# Patient Record
Sex: Female | Born: 1965 | Race: Black or African American | Hispanic: No | Smoking: Never smoker
Health system: Southern US, Community
[De-identification: ages and names within clinical notes are randomized; demographics above are authoritative.]

## PROBLEM LIST (undated history)

## (undated) ENCOUNTER — Emergency Department (HOSPITAL_COMMUNITY): Source: Home / Self Care

## (undated) DIAGNOSIS — M1990 Unspecified osteoarthritis, unspecified site: Secondary | ICD-10-CM

## (undated) DIAGNOSIS — I10 Essential (primary) hypertension: Secondary | ICD-10-CM

## (undated) DIAGNOSIS — D509 Iron deficiency anemia, unspecified: Secondary | ICD-10-CM

## (undated) DIAGNOSIS — G43909 Migraine, unspecified, not intractable, without status migrainosus: Secondary | ICD-10-CM

## (undated) DIAGNOSIS — A599 Trichomoniasis, unspecified: Secondary | ICD-10-CM

## (undated) HISTORY — DX: Migraine, unspecified, not intractable, without status migrainosus: G43.909

## (undated) HISTORY — DX: Iron deficiency anemia, unspecified: D50.9

## (undated) HISTORY — DX: Trichomoniasis, unspecified: A59.9

## (undated) HISTORY — DX: Unspecified osteoarthritis, unspecified site: M19.90

## (undated) HISTORY — PX: ABDOMINAL HYSTERECTOMY: SHX81

## (undated) HISTORY — PX: ECTOPIC PREGNANCY SURGERY: SHX613

---

## 2001-03-27 ENCOUNTER — Emergency Department (HOSPITAL_COMMUNITY): Admission: EM | Admit: 2001-03-27 | Discharge: 2001-03-27 | Payer: Self-pay | Admitting: Emergency Medicine

## 2001-03-27 ENCOUNTER — Encounter: Payer: Self-pay | Admitting: *Deleted

## 2002-05-22 ENCOUNTER — Emergency Department (HOSPITAL_COMMUNITY): Admission: EM | Admit: 2002-05-22 | Discharge: 2002-05-22 | Payer: Self-pay | Admitting: Emergency Medicine

## 2007-02-11 ENCOUNTER — Encounter (HOSPITAL_COMMUNITY): Admission: RE | Admit: 2007-02-11 | Discharge: 2007-03-13 | Payer: Self-pay | Admitting: Oncology

## 2007-02-11 ENCOUNTER — Ambulatory Visit (HOSPITAL_COMMUNITY): Payer: Self-pay | Admitting: Oncology

## 2007-03-19 ENCOUNTER — Encounter (HOSPITAL_COMMUNITY): Admission: RE | Admit: 2007-03-19 | Discharge: 2007-04-18 | Payer: Self-pay | Admitting: Oncology

## 2007-03-24 ENCOUNTER — Inpatient Hospital Stay (HOSPITAL_COMMUNITY): Admission: RE | Admit: 2007-03-24 | Discharge: 2007-03-26 | Payer: Self-pay | Admitting: Obstetrics & Gynecology

## 2007-03-24 ENCOUNTER — Encounter: Payer: Self-pay | Admitting: Obstetrics & Gynecology

## 2007-10-12 ENCOUNTER — Emergency Department (HOSPITAL_COMMUNITY): Admission: EM | Admit: 2007-10-12 | Discharge: 2007-10-12 | Payer: Self-pay | Admitting: Emergency Medicine

## 2008-05-11 ENCOUNTER — Emergency Department (HOSPITAL_COMMUNITY): Admission: EM | Admit: 2008-05-11 | Discharge: 2008-05-12 | Payer: Self-pay | Admitting: Emergency Medicine

## 2010-07-04 ENCOUNTER — Emergency Department (HOSPITAL_COMMUNITY)
Admission: EM | Admit: 2010-07-04 | Discharge: 2010-07-04 | Payer: Self-pay | Source: Home / Self Care | Admitting: Emergency Medicine

## 2010-08-25 ENCOUNTER — Encounter (HOSPITAL_COMMUNITY): Payer: Self-pay | Admitting: Oncology

## 2010-10-15 LAB — RAPID STREP SCREEN (MED CTR MEBANE ONLY): Streptococcus, Group A Screen (Direct): NEGATIVE

## 2010-12-17 NOTE — Discharge Summary (Signed)
NAMEJAYMI, TINNER                 ACCOUNT NO.:  [id number]   MEDICAL RECORD NO.:  [id number]          PATIENT TYPE:  INP   LOCATION:  A331                          FACILITY:  [facility]   PHYSICIAN:  Lazaro Arms, M.D.   DATE OF BIRTH:  [birth year]   DATE OF ADMISSION:  03/24/2007  DATE OF DISCHARGE:  08/22/2008LH                               DISCHARGE SUMMARY   DISCHARGE DIAGNOSES:  1. Status post abdominal hysterectomy.  2. Unremarkable postoperative course.   PROCEDURE:  Hysterectomy.   Please refer to the history and physical and the operative report for  details on admission to the hospital.   HOSPITAL COURSE:  The patient was admitted postoperatively and she did  quite well.  She tolerated clear liquids and a regular diet without  symptoms.  Hemoglobin and hematocrit postop was 11.7 and 35 on postop  day #1 and on postoperative day #2 was once again very stable, actually  increased to 12.1 and 36.3. Her pathology was returned as benign  fibroids.  Her incision was clean, dry and intact. She tolerated clear  liquids and a regular diet, voided without symptoms and was ambulatory.  She was discharged to home on the morning of postop day #2 in good  stable condition to followup in the office. She was given Percocet for  pain, Motrin for pain and Phenergan for nausea.  She will be seen back  in the office next week for incision evaluation.      Lazaro Arms, M.D.  Electronically Signed     LHE/MEDQ  D:  04/23/2007  T:  04/23/2007  Job:  [id number]

## 2010-12-17 NOTE — H&P (Signed)
Vanessa [name], Vanessa [name]                 ACCOUNT NO.:  [id number]   MEDICAL RECORD NO.:  [id number]          PATIENT TYPE:  AMB   LOCATION:  DAY                           FACILITY:  [facility]   PHYSICIAN:  Lazaro Arms, M.D.   DATE OF BIRTH:  Jan 11, 1966   DATE OF ADMISSION:  DATE OF DISCHARGE:  LH                              HISTORY & PHYSICAL   HISTORY OF THE PRESENT ILLNESS:  The patient is a 45 year old African-  American female, gravida 2, para 2, who was actually referred to me by  Dr. Mariel Sleet because of iron-deficiency anemia and menometrorrhagia.  Her hemoglobin was as low as 8.3 and hematocrit 25.7.  She was begun on  aggressive ferrous sulfate and subsequently was sent to me for  evaluation.  She also has __________ as a result of her anemia.   I saw her in July.  On examination then she had a significantly enlarged  fibroid uterus to 16 weeks size with multiple fibroids palpated.  She  states she bleeds for five days with lots of clots, and soils clothes  and sheets, and has significant cramping.  Thus, as a result her  hemoglobin was up to 11.5 in the office; therefore, she is admitted for  an abdominal hysterectomy.  She understands and agrees with the  management plan.   PAST MEDICAL HISTORY:  The past medical history is negative, except for  anemia.   PAST SURGICAL HISTORY:  Tubal ligation in 1991.   MEDICATIONS:  The patient's only medication is iron.   ALLERGIES:  The patient is not allergic to anything.   PHYSICAL EXAMINATION:  VITAL SIGNS:  The patient is 5 feet 7 inches and  weighs 120 pounds.  Blood pressure 100/70.  HEENT:  The head, eyes, ears, nose and throat is unremarkable.  NECK:  The thyroid is normal.  LUNGS:  The lungs are clear.  HEART:  The heart has a regular rate and rhythm with murmur,  regurgitation or gallop.  She does have a slight flow murmur, which is  soft.  BREASTS:  The breast are without mass, discharge or skin changes.  ABDOMEN:  The  abdomen is benign without hepatosplenomegaly.  She does  have a mass in the mid plane palpated as her fibroid uterus.  PELVIC EXAMINATION:  There is a normal external genitalia.  Vagina is  moist without discharge.  The uterus is 16 weeks size with multiple  fibroids palpated.  Adnexa is negative.  EXTREMITIES:  The extremities are without edema.  NEUROLOGIC:  The neurological exam is grossly intact.   IMPRESSION:  1. Enlarged fibroid uterus.  2. Menometrorrhagia.  3. Anemia.  4. Dysmenorrhea.   PLAN:  The patient is admitted for an abdominal hysterectomy.  She  understands the risks, benefits, indications and alternatives; and, will  proceed.      Lazaro Arms, M.D.  Electronically Signed     LHE/MEDQ  D:  03/23/2007  T:  03/24/2007  Job:  [id number]

## 2010-12-17 NOTE — Op Note (Signed)
[name], [name]                 ACCOUNT NO.:  [id number]   MEDICAL RECORD NO.:  [id number]          PATIENT TYPE:  INP   LOCATION:  A331                          FACILITY:  [facility]   PHYSICIAN:  Lazaro Arms, M.D.   DATE OF BIRTH:  1966/02/07   DATE OF PROCEDURE:  03/24/2007  DATE OF DISCHARGE:                               OPERATIVE REPORT   PREOPERATIVE DIAGNOSES:  1. Fibroids.  2. Menometrorrhagia.  3. Anemia, resolved with weekly intravenous iron.   POSTOPERATIVE DIAGNOSES:  1. Fibroids.  2. Menometrorrhagia.  3. Anemia, resolved with weekly intravenous iron.   PROCEDURE:  Abdominal hysterectomy.   SURGEON:  Lazaro Arms, MD   ANESTHESIA:  General endotracheal.   FINDINGS:  The patient had a large fibroid uterus, probably 14- to 16-  week size.  Multiple fibroids were palpated.  The ovaries were normal.   DESCRIPTION OF OPERATION:  The patient was taken to the operating room,  placed in supine position, underwent general endotracheal anesthesia.  The vagina was prepped.  Foley catheter was placed.  The abdomen was  prepped and draped in the usual sterile fashion.  A transverse minilap  incision was made, carried down sharply through the rectus fascia,  scored in the midline.  The muscles were divided.  The peritoneal cavity  was entered.  A small Alexis self-retaining wound retractor was placed.  The upper abdomen was packed away.  The uterine cornua were grasped.  Round ligament was suture ligated and cut bilaterally.  The utero-  ovarian ligaments were clamped, cut and suture ligated bilaterally.  The  bladder flap was created.  Uterine vessels were skeletonized, clamped,  cut and suture ligated bilaterally.  Several pedicles were taken down to  cardinal ligament down to the cervix, each pedicle being clamped, cut  and suture ligated.  The vagina was crossclamped.  Vaginal angle sutures  were placed.  The vagina was closed with interrupted figure-of-eight  sutures.  The specimen was removed and sent to pathology for routine  evaluation.  The pelvis was irrigated vigorously and all pedicles were  found to be hemostatic.  Interceed was placed to prevent postoperative  adhesion.  The retractor was removed, the muscles reapproximated loosely  along with the peritoneum.  The fascia closed using #0 Vicryl running.  Subcutaneous tissues made hemostatic and  irrigated.  The skin was closed using 3-0 Vicryl on a Keith needle in a  subcuticular fashion, and Dermabond was placed for additional skin  adhesion and bandage.  The patient tolerated the procedure well.  She  experienced 50 cc of blood loss.  She was taken to the recovery room in  good stable condition.  All counts were correct times 3.      Lazaro Arms, M.D.  Electronically Signed     LHE/MEDQ  D:  03/24/2007  T:  03/25/2007  Job:  [id number]

## 2011-05-16 LAB — CBC
HCT: 34.9 — ABNORMAL LOW
HCT: 39.6
Hemoglobin: 12.9
MCHC: 32.7
MCHC: 33.5
MCV: 74.1 — ABNORMAL LOW
MCV: 74.1 — ABNORMAL LOW
MCV: 74.7 — ABNORMAL LOW
Platelets: 218
Platelets: 224
Platelets: 239
RBC: 4.86
RBC: 5.34 — ABNORMAL HIGH
RDW: 24.5 — ABNORMAL HIGH
RDW: 25 — ABNORMAL HIGH
WBC: 10.3
WBC: 14.7 — ABNORMAL HIGH
WBC: 3.8 — ABNORMAL LOW

## 2011-05-16 LAB — URINALYSIS, ROUTINE W REFLEX MICROSCOPIC
Bilirubin Urine: NEGATIVE
Nitrite: NEGATIVE
Specific Gravity, Urine: 1.015
Urobilinogen, UA: 0.2
pH: 6

## 2011-05-16 LAB — DIFFERENTIAL
Basophils Absolute: 0
Basophils Absolute: 0
Basophils Relative: 0
Eosinophils Absolute: 0
Eosinophils Relative: 0
Eosinophils Relative: 1
Lymphocytes Relative: 12
Lymphs Abs: 1.2
Monocytes Relative: 7
Neutrophils Relative %: 80 — ABNORMAL HIGH
Neutrophils Relative %: 87 — ABNORMAL HIGH

## 2011-05-16 LAB — BASIC METABOLIC PANEL
BUN: 7
CO2: 30
Calcium: 9
Chloride: 104
Creatinine, Ser: 0.79
GFR calc Af Amer: >60
GFR calc non Af Amer: >60
Glucose, Bld: 86
Potassium: 3.4 — ABNORMAL LOW
Sodium: 136

## 2011-05-16 LAB — CROSSMATCH

## 2011-05-16 LAB — URINE MICROSCOPIC-ADD ON

## 2011-05-20 LAB — IRON AND TIBC
Saturation Ratios: 19 — ABNORMAL LOW
TIBC: 345
UIBC: 279

## 2011-05-20 LAB — CBC
Hemoglobin: 11.4 — ABNORMAL LOW
RDW: 29 — ABNORMAL HIGH
WBC: 5.3

## 2011-05-20 LAB — RETICULOCYTES: Retic Ct Pct: 3.3 — ABNORMAL HIGH

## 2012-08-16 ENCOUNTER — Emergency Department (HOSPITAL_COMMUNITY)
Admission: EM | Admit: 2012-08-16 | Discharge: 2012-08-16 | Disposition: A | Discharge status: Home / Self Care | Payer: Self-pay | Attending: Emergency Medicine | Admitting: Emergency Medicine

## 2012-08-16 ENCOUNTER — Encounter (HOSPITAL_COMMUNITY): Payer: Self-pay

## 2012-08-16 ENCOUNTER — Emergency Department (HOSPITAL_COMMUNITY): Payer: Self-pay

## 2012-08-16 DIAGNOSIS — R1033 Periumbilical pain: Secondary | ICD-10-CM | POA: Insufficient documentation

## 2012-08-16 DIAGNOSIS — I10 Essential (primary) hypertension: Secondary | ICD-10-CM | POA: Insufficient documentation

## 2012-08-16 DIAGNOSIS — N390 Urinary tract infection, site not specified: Secondary | ICD-10-CM | POA: Insufficient documentation

## 2012-08-16 DIAGNOSIS — R109 Unspecified abdominal pain: Secondary | ICD-10-CM

## 2012-08-16 HISTORY — DX: Essential (primary) hypertension: I10

## 2012-08-16 LAB — URINALYSIS, ROUTINE W REFLEX MICROSCOPIC
Bilirubin Urine: NEGATIVE
Glucose, UA: NEGATIVE mg/dL
Protein, ur: NEGATIVE mg/dL
Specific Gravity, Urine: 1.015 (ref 1.005–1.030)

## 2012-08-16 LAB — URINE MICROSCOPIC-ADD ON

## 2012-08-16 LAB — COMPREHENSIVE METABOLIC PANEL
AST: 13 U/L (ref 0–37)
BUN: 9 mg/dL (ref 6–23)
CO2: 28 mEq/L (ref 19–32)
Chloride: 105 mEq/L (ref 96–112)
Creatinine, Ser: 0.98 mg/dL (ref 0.50–1.10)
GFR calc Af Amer: 79 mL/min — ABNORMAL LOW (ref >90)
GFR calc non Af Amer: 68 mL/min — ABNORMAL LOW (ref >90)
Glucose, Bld: 97 mg/dL (ref 70–99)
Total Bilirubin: 0.8 mg/dL (ref 0.3–1.2)

## 2012-08-16 LAB — CBC WITH DIFFERENTIAL/PLATELET
HCT: 39.2 % (ref 36.0–46.0)
Hemoglobin: 13.3 g/dL (ref 12.0–15.0)
Lymphocytes Relative: 29 % (ref 12–46)
Monocytes Absolute: 0.5 10*3/uL (ref 0.1–1.0)
Monocytes Relative: 10 % (ref 3–12)
Neutro Abs: 2.9 10*3/uL (ref 1.7–7.7)
WBC: 5.1 10*3/uL (ref 4.0–10.5)

## 2012-08-16 LAB — PREGNANCY, URINE: Preg Test, Ur: NEGATIVE

## 2012-08-16 LAB — LIPASE, BLOOD: Lipase: 31 U/L (ref 11–59)

## 2012-08-16 MED ORDER — SULFAMETHOXAZOLE-TRIMETHOPRIM 200-40 MG/5ML PO SUSP: 20.0000 mL | Freq: Once | ORAL | Status: DC

## 2012-08-16 MED ORDER — SULFAMETHOXAZOLE-TMP DS 800-160 MG PO TABS
1.0000 | ORAL_TABLET | Freq: Once | ORAL | Status: AC
  Administered 2012-08-16: 1 via ORAL
  Filled 2012-08-16: qty 1

## 2012-08-16 MED ORDER — NAPROXEN 500 MG PO TABS: 500.0000 mg | ORAL_TABLET | Freq: Two times a day (BID) | ORAL | Status: DC

## 2012-08-16 MED ORDER — NAPROXEN 250 MG PO TABS
500.0000 mg | ORAL_TABLET | Freq: Once | ORAL | Status: AC
  Administered 2012-08-16: 500 mg via ORAL
  Filled 2012-08-16: qty 2

## 2012-08-16 MED ORDER — SULFAMETHOXAZOLE-TRIMETHOPRIM 800-160 MG PO TABS: 1.0000 | ORAL_TABLET | Freq: Two times a day (BID) | ORAL | Status: DC

## 2012-08-16 NOTE — ED Notes (Signed)
Pain going across my stomach per pt. Sharp pain per pt. Denies nausea, vomiting, and diarrhea per pt. Woke up with the pain per pt.

## 2012-08-16 NOTE — ED Provider Notes (Signed)
History     CSN: [id number]  Arrival date & time 08/16/12  4098   First MD Initiated Contact with Patient 08/16/12 0354      Chief Complaint  Patient presents with  . Abdominal Pain    (Consider location/radiation/quality/duration/timing/severity/associated sxs/prior treatment) HPI Comments: 47 year old female with a history of hysterectomy several years ago who presents with a complaint of abdominal pain which she states is periumbilical. This was acute in onset, awoke her from sleep, is sharp and stabbing and comes in waves. She denies nausea vomiting or diarrhea and has very rare bowel movements which is normal for her to have only one bowel movement per week. She has not had dysuria, she denies fevers, nothing seems to make the pain better but it is worse when she palpates her abdomen.  Patient is a 47 y.o. female presenting with abdominal pain. The history is provided by the patient and the spouse.  Abdominal Pain The primary symptoms of the illness include abdominal pain.    Past Medical History  Diagnosis Date  . Hypertension     Past Surgical History  Procedure Date  . Abdominal hysterectomy     No family history on file.  History  Substance Use Topics  . Smoking status: Never Smoker   . Smokeless tobacco: Not on file  . Alcohol Use: No    OB History    Grav Para Term Preterm Abortions TAB SAB Ect Mult Living                  Review of Systems  Gastrointestinal: Positive for abdominal pain.  All other systems reviewed and are negative.    Allergies  Review of patient's allergies indicates no known allergies.  Home Medications   Current Outpatient Rx  Name  Route  Sig  Dispense  Refill  . NAPROXEN 500 MG PO TABS   Oral   Take 1 tablet (500 mg total) by mouth 2 (two) times daily with a meal.   30 tablet   0   . SULFAMETHOXAZOLE-TRIMETHOPRIM 800-160 MG PO TABS   Oral   Take 1 tablet by mouth every 12 (twelve) hours.   10 tablet   0     BP  174/106  Pulse 76  Temp 97.8 F (36.6 C)  Resp 16  Ht 5\' 6"  (1.676 m)  Wt 125 lb (56.7 kg)  BMI 20.18 kg/m2  SpO2 100%  Physical Exam  Nursing note and vitals reviewed. Constitutional: She appears well-developed and well-nourished. No distress.  HENT:  Head: Normocephalic and atraumatic.  Mouth/Throat: Oropharynx is clear and moist. No oropharyngeal exudate.  Eyes: Conjunctivae normal and EOM are normal. Pupils are equal, round, and reactive to light. Right eye exhibits no discharge. Left eye exhibits no discharge. No scleral icterus.  Neck: Normal range of motion. Neck supple. No JVD present. No thyromegaly present.  Cardiovascular: Normal rate, regular rhythm, normal heart sounds and intact distal pulses.  Exam reveals no gallop and no friction rub.   No murmur heard. Pulmonary/Chest: Effort normal and breath sounds normal. No respiratory distress. She has no wheezes. She has no rales.  Abdominal: Soft. Bowel sounds are normal. She exhibits no distension and no mass. There is tenderness ( Focal left upper quadrant tenderness, no other abdominal tenderness. Very soft, very non-peritoneal, no guarding).  Musculoskeletal: Normal range of motion. She exhibits no edema and no tenderness.  Lymphadenopathy:    She has no cervical adenopathy.  Neurological: She is alert. Coordination normal.  Skin: Skin is warm and dry. No rash noted. No erythema.  Psychiatric: She has a normal mood and affect. Her behavior is normal.    ED Course  Procedures (including critical care time)  Labs Reviewed  URINALYSIS, ROUTINE W REFLEX MICROSCOPIC - Abnormal; Notable for the following:    Hgb urine dipstick TRACE (*)     Leukocytes, UA SMALL (*)     All other components within normal limits  COMPREHENSIVE METABOLIC PANEL - Abnormal; Notable for the following:    GFR calc non Af Amer 68 (*)     GFR calc Af Amer 79 (*)     All other components within normal limits  URINE MICROSCOPIC-ADD ON - Abnormal;  Notable for the following:    Squamous Epithelial / LPF MANY (*)     Bacteria, UA MANY (*)     All other components within normal limits  PREGNANCY, URINE  CBC WITH DIFFERENTIAL  LIPASE, BLOOD  URINE CULTURE   Dg Abd Acute W/chest  08/16/2012  *RADIOLOGY REPORT*  Clinical Data: Sharp stomach pain.  ACUTE ABDOMEN SERIES (ABDOMEN 2 VIEW & CHEST 1 VIEW)  Comparison: Chest 07/04/2010  Findings: Mild hyperinflation. Normal heart size and pulmonary vascularity.  No focal consolidation in the lungs.  No blunting of costophrenic angles.  No pneumothorax.  Mediastinal contours appear intact.  No significant change since previous study.  Scattered gas and stool in the colon.  No small or large bowel dilatation.  No free intra-abdominal air.  No abnormal air fluid levels.  No radiopaque stones.  IMPRESSION: No evidence of active pulmonary disease.  Nonobstructive bowel gas pattern.   Original Report Authenticated By: Burman Nieves, M.D.      1. Abdominal pain   2. UTI (urinary tract infection)       MDM  Check for urinary infection, rule out pancreatitis, acute abdominal series to evaluate bowels, constipation, possible bowel obstruction though less likely given no distention and no nausea.  Labs reviewed, no leukocytosis, normal lytes and renal function - UA shows UTI with many bacteria and 11-20 WBC.  She has a normal xray without acute findings including no free air and no SBO.  Pt reevluated - naprosyn and bactrim given - she appears stable and is willing to continue treatment at home - will f/u PRN.  PA and abd imaging were obtained by digital radiography. I have personally interpreted these x-rays and find her to be no signs of pulmonary infiltrate, cardiomegaly, subdiaphragmatic free air, soft tissue abnormality, no obvious bony abnormalities or fractures.    Vida Roller, MD 08/16/12 [phone]

## 2012-08-16 NOTE — ED Notes (Signed)
Patient ambulatory to bathroom.

## 2012-08-17 LAB — URINE CULTURE: Colony Count: >=100000

## 2013-06-18 ENCOUNTER — Emergency Department (HOSPITAL_COMMUNITY): Payer: Self-pay

## 2013-06-18 ENCOUNTER — Encounter (HOSPITAL_COMMUNITY): Payer: Self-pay | Admitting: Emergency Medicine

## 2013-06-18 ENCOUNTER — Emergency Department (HOSPITAL_COMMUNITY)
Admission: EM | Admit: 2013-06-18 | Discharge: 2013-06-18 | Disposition: A | Discharge status: Home / Self Care | Payer: Self-pay | Attending: Emergency Medicine | Admitting: Emergency Medicine

## 2013-06-18 DIAGNOSIS — Y929 Unspecified place or not applicable: Secondary | ICD-10-CM | POA: Insufficient documentation

## 2013-06-18 DIAGNOSIS — W010XXA Fall on same level from slipping, tripping and stumbling without subsequent striking against object, initial encounter: Secondary | ICD-10-CM | POA: Insufficient documentation

## 2013-06-18 DIAGNOSIS — Y9301 Activity, walking, marching and hiking: Secondary | ICD-10-CM | POA: Insufficient documentation

## 2013-06-18 DIAGNOSIS — S93409A Sprain of unspecified ligament of unspecified ankle, initial encounter: Secondary | ICD-10-CM | POA: Insufficient documentation

## 2013-06-18 DIAGNOSIS — W108XXA Fall (on) (from) other stairs and steps, initial encounter: Secondary | ICD-10-CM | POA: Insufficient documentation

## 2013-06-18 DIAGNOSIS — Z791 Long term (current) use of non-steroidal anti-inflammatories (NSAID): Secondary | ICD-10-CM | POA: Insufficient documentation

## 2013-06-18 DIAGNOSIS — S93401A Sprain of unspecified ligament of right ankle, initial encounter: Secondary | ICD-10-CM

## 2013-06-18 DIAGNOSIS — I10 Essential (primary) hypertension: Secondary | ICD-10-CM | POA: Insufficient documentation

## 2013-06-18 MED ORDER — HYDROCODONE-ACETAMINOPHEN 5-325 MG PO TABS: ORAL_TABLET | ORAL | Status: DC

## 2013-06-18 MED ORDER — NAPROXEN 250 MG PO TABS: 250.0000 mg | ORAL_TABLET | Freq: Two times a day (BID) | ORAL | Status: DC

## 2013-06-18 NOTE — ED Provider Notes (Signed)
CSN: [id number]     Arrival date & time 06/18/13  1658 History   First MD Initiated Contact with Patient 06/18/13 1705     Chief Complaint  Patient presents with  . Ankle Pain    HPI Pt was seen at 1715. Per pt, c/o sudden onset and resolution of one episode of trip on the steps PTA. Pt states she was walking down the steps and stepped in a gap between the steps and the ground, twisting her right ankle. Pt c/o pain in her right ankle only. Has been ambulatory since the injury. Denies right hip/knee/foot pain. Denies focal motor weakness, no tingling/numbness in extremities. Denies open wounds.     Past Medical History  Diagnosis Date  . Hypertension    Past Surgical History  Procedure Laterality Date  . Abdominal hysterectomy      History  Substance Use Topics  . Smoking status: Never Smoker   . Smokeless tobacco: Not on file  . Alcohol Use: No    Review of Systems .ROS: Statement: All systems negative except as marked or noted in the HPI; Constitutional: Negative for fever and chills. ; ; Eyes: Negative for eye pain, redness and discharge. ; ; ENMT: Negative for ear pain, hoarseness, nasal congestion, sinus pressure and sore throat. ; ; Cardiovascular: Negative for chest pain, palpitations, diaphoresis, dyspnea and peripheral edema. ; ; Respiratory: Negative for cough, wheezing and stridor. ; ; Gastrointestinal: Negative for nausea, vomiting, diarrhea, abdominal pain, blood in stool, hematemesis, jaundice and rectal bleeding. . ; ; Genitourinary: Negative for dysuria, flank pain and hematuria. ; ; Musculoskeletal: Negative for back pain and neck pain. +right ankle pain and swelling.; ; Skin: Negative for pruritus, rash, abrasions, blisters, bruising and skin lesion.; ; Neuro: Negative for headache, lightheadedness and neck stiffness. Negative for weakness, altered level of consciousness , altered mental status, extremity weakness, paresthesias, involuntary movement, seizure and syncope.        Allergies  Review of patient's allergies indicates no known allergies.  Home Medications   Current Outpatient Rx  Name  Route  Sig  Dispense  Refill  . HYDROcodone-acetaminophen (NORCO/VICODIN) 5-325 MG per tablet      1 or 2 tabs PO q6 hours prn pain   10 tablet   0   . naproxen (NAPROSYN) 250 MG tablet   Oral   Take 1 tablet (250 mg total) by mouth 2 (two) times daily with a meal.   14 tablet   0   . naproxen (NAPROSYN) 500 MG tablet   Oral   Take 1 tablet (500 mg total) by mouth 2 (two) times daily with a meal.   30 tablet   0   . sulfamethoxazole-trimethoprim (SEPTRA DS) 800-160 MG per tablet   Oral   Take 1 tablet by mouth every 12 (twelve) hours.   10 tablet   0    BP 138/76  Pulse 97  Temp(Src) 97.5 F (36.4 C) (Oral)  Resp 24  Wt 125 lb (56.7 kg)  SpO2 100% Physical Exam 1720: Physical examination:  Nursing notes reviewed; Vital signs and O2 SAT reviewed;  Constitutional: Well developed, Well nourished, Well hydrated, In no acute distress; Head:  Normocephalic, atraumatic; Eyes: EOMI, PERRL, No scleral icterus; ENMT: Mouth and pharynx normal, Mucous membranes moist; Neck: Supple, Full range of motion, No lymphadenopathy; Cardiovascular: Regular rate and rhythm, No gallop; Respiratory: Breath sounds clear & equal bilaterally, No wheezes.  Speaking full sentences with ease, Normal respiratory effort/excursion; Chest: Nontender, Movement  normal; Abdomen: Soft, Nontender, Nondistended, Normal bowel sounds;; Extremities: Pulses normal, +tender to palp right lateral maleolar area w/localized edema, NMS intact right foot, strong pedal pp, LE muscle compartments soft.  No right proximal fibular head tenderness, no knee tenderness, no foot tenderness.  No deformity, no ecchymosis, no open wounds.  +plantarflexion of right foot w/calf squeeze.  No palpable gap right Achilles's tendon.  Decreased ROM F/E d/t pain. No calf edema or asymmetry.; Neuro: AA&Ox3, Major CN  grossly intact.  Speech clear. No gross focal motor or sensory deficits in extremities.; Skin: Color normal, Warm, Dry.   ED Course  Procedures    EKG Interpretation   None       MDM  MDM Reviewed: previous chart, nursing note and vitals Interpretation: x-ray   Dg Ankle Complete Right 06/18/2013   CLINICAL DATA:  Right foot and ankle pain.  EXAM: RIGHT ANKLE - COMPLETE 3+ VIEW  COMPARISON:  None.  FINDINGS: Right ankle demonstrates no fracture or dislocation. The ankle mortise is intact. There is mild soft tissue swelling over the lateral malleolus.  IMPRESSION: No acute osseous injury of the right ankle.   Electronically Signed   By: Elige Ko   On: 06/18/2013 17:29   Dg Foot Complete Right 06/18/2013   CLINICAL DATA:  Twisted the right ankle and foot earlier today  EXAM: RIGHT FOOT COMPLETE - 3+ VIEW  COMPARISON:  None.  FINDINGS: There is no evidence of fracture or dislocation. There is no evidence of arthropathy or other focal bone abnormality. Soft tissues are unremarkable.  IMPRESSION: No acute osseous injury of the right foot.   Electronically Signed   By: Elige Ko   On: 06/18/2013 17:29    1745:  No fx, will treat symptomatically. Dx and testing d/w pt.  Questions answered.  Verb understanding, agreeable to d/c home with outpt f/u.   Laray Anger, DO 06/19/13 1421

## 2013-06-18 NOTE — ED Notes (Signed)
Twisted right foot today .

## 2014-08-11 ENCOUNTER — Other Ambulatory Visit (HOSPITAL_COMMUNITY): Payer: Self-pay | Admitting: Internal Medicine

## 2014-08-11 DIAGNOSIS — Z1231 Encounter for screening mammogram for malignant neoplasm of breast: Secondary | ICD-10-CM

## 2014-09-04 ENCOUNTER — Ambulatory Visit (HOSPITAL_COMMUNITY)
Admission: RE | Admit: 2014-09-04 | Discharge: 2014-09-04 | Disposition: A | Discharge status: Home / Self Care | Payer: BLUE CROSS/BLUE SHIELD | Source: Ambulatory Visit | Attending: Internal Medicine | Admitting: Internal Medicine

## 2014-09-04 DIAGNOSIS — Z1231 Encounter for screening mammogram for malignant neoplasm of breast: Secondary | ICD-10-CM | POA: Insufficient documentation

## 2015-06-25 ENCOUNTER — Encounter (HOSPITAL_COMMUNITY): Payer: Self-pay | Admitting: Emergency Medicine

## 2015-06-25 ENCOUNTER — Emergency Department (HOSPITAL_COMMUNITY)
Admission: EM | Admit: 2015-06-25 | Discharge: 2015-06-25 | Disposition: A | Discharge status: Home / Self Care | Payer: BLUE CROSS/BLUE SHIELD | Attending: Emergency Medicine | Admitting: Emergency Medicine

## 2015-06-25 DIAGNOSIS — R05 Cough: Secondary | ICD-10-CM | POA: Diagnosis not present

## 2015-06-25 DIAGNOSIS — M62830 Muscle spasm of back: Secondary | ICD-10-CM | POA: Diagnosis not present

## 2015-06-25 DIAGNOSIS — I10 Essential (primary) hypertension: Secondary | ICD-10-CM | POA: Insufficient documentation

## 2015-06-25 DIAGNOSIS — Z791 Long term (current) use of non-steroidal anti-inflammatories (NSAID): Secondary | ICD-10-CM | POA: Diagnosis not present

## 2015-06-25 DIAGNOSIS — M545 Low back pain: Secondary | ICD-10-CM | POA: Diagnosis present

## 2015-06-25 LAB — URINE MICROSCOPIC-ADD ON

## 2015-06-25 LAB — URINALYSIS, ROUTINE W REFLEX MICROSCOPIC
Bilirubin Urine: NEGATIVE
Glucose, UA: NEGATIVE mg/dL
Ketones, ur: NEGATIVE mg/dL
LEUKOCYTES UA: NEGATIVE
NITRITE: NEGATIVE
PROTEIN: NEGATIVE mg/dL
SPECIFIC GRAVITY, URINE: 1.025 (ref 1.005–1.030)
pH: 6.5 (ref 5.0–8.0)

## 2015-06-25 MED ORDER — DICLOFENAC SODIUM 75 MG PO TBEC: 75.0000 mg | DELAYED_RELEASE_TABLET | Freq: Two times a day (BID) | ORAL | Status: DC

## 2015-06-25 MED ORDER — CYCLOBENZAPRINE HCL 10 MG PO TABS: 10.0000 mg | ORAL_TABLET | Freq: Two times a day (BID) | ORAL | Status: DC | PRN

## 2015-06-25 NOTE — Discharge Instructions (Signed)
Your urine test is negative for signs of infection or kidney stone. Your examination favors a muscle strain involving her lower back. Please apply heating pad. Please use Flexeril 2 times daily, as well as diclofenac 2 times daily with food. Please see Dr. Felecia ShellingFanta for additional evaluation.

## 2015-06-25 NOTE — ED Provider Notes (Signed)
CSN: [id number]     Arrival date & time 06/25/15  1003 History  By signing my name below, I, Ronney LionSuzanne Le, attest that this documentation has been prepared under the direction and in the presence of Ivery QualeHobson Rowland Ericsson, PA-C. Electronically Signed: Ronney LionSuzanne Le, ED Scribe. 06/25/2015. 4:36 PM.    Chief Complaint  Patient presents with  . Back Pain   Patient is a 49 y.o. female presenting with back pain. The history is provided by the patient. No language interpreter was used.  Back Pain Location:  Lumbar spine Quality:  Aching Pain severity:  Moderate Onset quality:  Gradual Duration:  1 day Timing:  Constant Chronicity:  New Context comment:  Strenuous housework and job Relieved by:  None tried Worsened by:  Coughing Ineffective treatments:  None tried Associated symptoms: no abdominal pain, no bladder incontinence, no fever, no paresthesias and no perianal numbness   Risk factors: no recent surgery     HPI Comments: Vanessa [name] is a 49 y.o. female who presents to the Emergency Department complaining of atraumatic back pain that began yesterday when patient was sitting on the couch. She states she had been busy doing strenuous housework yesterday and had gone to the couch to the rest when her pain set in. 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 She denies any trauma, falls, or injuries. Patient states every 4 steps today she has had to stop and grab the wall secondary to her pain. Patient also complains of a cough recently, which exacerbates her back pain. She states she has been feeling "gassy" lately and wonders if that may affect her symptoms. She denies fever.   Past Medical History  Diagnosis Date  . Hypertension    Past Surgical History  Procedure Laterality Date  . Abdominal hysterectomy     History reviewed. No pertinent family history. Social History  Substance Use Topics  . Smoking status: Never Smoker   . Smokeless tobacco:  None  . Alcohol Use: No   OB History    No data available     Review of Systems  Constitutional: Negative for fever.  Respiratory: Positive for cough.   Gastrointestinal: Negative for abdominal pain.  Genitourinary: Negative for bladder incontinence.  Musculoskeletal: Positive for back pain.  Neurological: Negative for paresthesias.  All other systems reviewed and are negative.     Allergies  Review of patient's allergies indicates no known allergies.  Home Medications   Prior to Admission medications   Medication Sig Start Date End Date Taking? Authorizing Provider  HYDROcodone-acetaminophen (NORCO/VICODIN) 5-325 MG per tablet 1 or 2 tabs PO q6 hours prn pain 06/18/13   Samuel JesterKathleen McManus, DO  naproxen (NAPROSYN) 250 MG tablet Take 1 tablet (250 mg total) by mouth 2 (two) times daily with a meal. 06/18/13   Samuel JesterKathleen McManus, DO  naproxen (NAPROSYN) 500 MG tablet Take 1 tablet (500 mg total) by mouth 2 (two) times daily with a meal. 08/16/12   Eber HongBrian Miller, MD  sulfamethoxazole-trimethoprim (SEPTRA DS) 800-160 MG per tablet Take 1 tablet by mouth every 12 (twelve) hours. 08/16/12   Eber HongBrian Miller, MD   BP 141/81 mmHg  Pulse 64  Temp(Src) 97.9 F (36.6 C) (Oral)  Resp 16  Ht 5\' 7"  (1.702 m)  Wt 135 lb (61.236 kg)  BMI 21.14 kg/m2  SpO2 100% Physical Exam  Constitutional: She is oriented to person, place, and time. She appears well-developed and well-nourished. No distress.  HENT:  Head:  Normocephalic and atraumatic.  Eyes: Conjunctivae and EOM are normal.  Neck: Neck supple. No tracheal deviation present.  Cardiovascular: Normal rate.   Pulmonary/Chest: Effort normal and breath sounds normal. No respiratory distress.  Symmetrical rise and fall of the chest. Lungs are clear.   Abdominal: She exhibits no mass.  Gasseous bowel sounds. No mass.  Musculoskeletal: Normal range of motion. She exhibits tenderness.  Left paraspinal tenderness with ROM of the lumbar area.   Neurological: She is alert and oriented to person, place, and time.  Skin: Skin is warm and dry.  Psychiatric: She has a normal mood and affect. Her behavior is normal.  Nursing note and vitals reviewed.   ED Course  Procedures (including critical care time)  DIAGNOSTIC STUDIES: Oxygen Saturation is 100% on RA, normal by my interpretation.    COORDINATION OF CARE: 11:57 AM - Suspect muscle strain, either from doing strenuous housework or from her physically demanding job. Discussed treatment plan with pt at bedside which includes UA to r/o UTI. Advised pt to use a laxative to alleviate her GI discomfort. Pt verbalized understanding and agreed to plan.   Labs Review Labs Reviewed  URINALYSIS, ROUTINE W REFLEX MICROSCOPIC (NOT AT [facility]) - Abnormal; Notable for the following:    Hgb urine dipstick SMALL (*)    All other components within normal limits  URINE MICROSCOPIC-ADD ON - Abnormal; Notable for the following:    Squamous Epithelial / LPF 0-5 (*)    Bacteria, UA FEW (*)    All other components within normal limits    MDM  Exam is consistent with muscle strain. UA neg for acute infection. Rx for flexeril and voltarin given. Pt to rest back as much as possible.   Final diagnoses:  Spasm of lumbar paraspinous muscle    **I personally performed the services described in this documentation, which was scribed in my presence. The recorded information has been reviewed and is accurate.Ivery Quale, PA-C 06/30/15 1921  Cathren Laine, MD 07/03/15 [phone]

## 2015-06-25 NOTE — ED Notes (Signed)
Back pain since yesterday and rates pain 4/10 currently and increases to 8/10 at times.  Denies any back injury.

## 2015-08-24 ENCOUNTER — Other Ambulatory Visit (HOSPITAL_COMMUNITY): Payer: Self-pay | Admitting: Internal Medicine

## 2015-08-24 DIAGNOSIS — Z1231 Encounter for screening mammogram for malignant neoplasm of breast: Secondary | ICD-10-CM

## 2015-10-01 ENCOUNTER — Ambulatory Visit (HOSPITAL_COMMUNITY): Payer: BLUE CROSS/BLUE SHIELD

## 2016-04-11 ENCOUNTER — Encounter (HOSPITAL_COMMUNITY): Payer: Self-pay

## 2016-04-11 ENCOUNTER — Emergency Department (HOSPITAL_COMMUNITY)
Admission: EM | Admit: 2016-04-11 | Discharge: 2016-04-12 | Disposition: A | Discharge status: Home / Self Care | Payer: BLUE CROSS/BLUE SHIELD | Attending: Emergency Medicine | Admitting: Emergency Medicine

## 2016-04-11 ENCOUNTER — Emergency Department (HOSPITAL_COMMUNITY)
Admission: EM | Admit: 2016-04-11 | Discharge: 2016-04-11 | Disposition: A | Discharge status: Home / Self Care | Payer: BLUE CROSS/BLUE SHIELD

## 2016-04-11 DIAGNOSIS — R079 Chest pain, unspecified: Secondary | ICD-10-CM | POA: Insufficient documentation

## 2016-04-11 DIAGNOSIS — Z79899 Other long term (current) drug therapy: Secondary | ICD-10-CM | POA: Diagnosis not present

## 2016-04-11 DIAGNOSIS — Y999 Unspecified external cause status: Secondary | ICD-10-CM | POA: Insufficient documentation

## 2016-04-11 DIAGNOSIS — M7918 Myalgia, other site: Secondary | ICD-10-CM

## 2016-04-11 DIAGNOSIS — Y92410 Unspecified street and highway as the place of occurrence of the external cause: Secondary | ICD-10-CM | POA: Diagnosis not present

## 2016-04-11 DIAGNOSIS — I10 Essential (primary) hypertension: Secondary | ICD-10-CM | POA: Diagnosis not present

## 2016-04-11 DIAGNOSIS — Y9389 Activity, other specified: Secondary | ICD-10-CM | POA: Insufficient documentation

## 2016-04-11 DIAGNOSIS — M542 Cervicalgia: Secondary | ICD-10-CM | POA: Diagnosis present

## 2016-04-11 DIAGNOSIS — M791 Myalgia: Secondary | ICD-10-CM | POA: Diagnosis not present
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
## 2016-04-11 NOTE — ED Triage Notes (Signed)
Per registration clerk, patient left the department

## 2016-04-12 DIAGNOSIS — M791 Myalgia: Secondary | ICD-10-CM | POA: Diagnosis not present

## 2016-04-12 MED ORDER — NAPROXEN 250 MG PO TABS
500.0000 mg | ORAL_TABLET | Freq: Once | ORAL | Status: AC
  Administered 2016-04-12: 500 mg via ORAL
  Filled 2016-04-12: qty 2

## 2016-04-12 MED ORDER — CYCLOBENZAPRINE HCL 10 MG PO TABS
5.0000 mg | ORAL_TABLET | Freq: Once | ORAL | Status: AC
  Administered 2016-04-12: 5 mg via ORAL
  Filled 2016-04-12: qty 1

## 2016-04-12 MED ORDER — NAPROXEN 500 MG PO TABS: 500.0000 mg | ORAL_TABLET | Freq: Two times a day (BID) | ORAL | 0 refills | Status: DC

## 2016-04-12 MED ORDER — CYCLOBENZAPRINE HCL 5 MG PO TABS: 5.0000 mg | ORAL_TABLET | Freq: Three times a day (TID) | ORAL | 0 refills | Status: DC | PRN
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
## 2016-04-12 NOTE — Discharge Instructions (Signed)
Ice packs to the injured or sore muscles for the next several days and use heat to relax your muscles. Take the medications for pain and muscle spasms. Return to the ED for any problems listed on the head injury sheet. Recheck if you aren't improving in the next week.

## 2016-04-14 ENCOUNTER — Ambulatory Visit (HOSPITAL_COMMUNITY)
Admission: RE | Admit: 2016-04-14 | Discharge: 2016-04-14 | Disposition: A | Discharge status: Home / Self Care | Payer: BLUE CROSS/BLUE SHIELD | Source: Ambulatory Visit | Attending: Internal Medicine | Admitting: Internal Medicine

## 2016-04-14 ENCOUNTER — Other Ambulatory Visit (HOSPITAL_COMMUNITY): Payer: Self-pay | Admitting: Internal Medicine

## 2016-04-14 DIAGNOSIS — M542 Cervicalgia: Secondary | ICD-10-CM | POA: Insufficient documentation

## 2016-04-23 ENCOUNTER — Encounter (HOSPITAL_COMMUNITY): Payer: Self-pay | Admitting: Physical Therapy

## 2016-04-23 ENCOUNTER — Ambulatory Visit (HOSPITAL_COMMUNITY): Payer: BLUE CROSS/BLUE SHIELD | Attending: Internal Medicine | Admitting: Physical Therapy

## 2016-04-23 DIAGNOSIS — R293 Abnormal posture: Secondary | ICD-10-CM | POA: Insufficient documentation

## 2016-04-23 DIAGNOSIS — M545 Low back pain, unspecified: Secondary | ICD-10-CM

## 2016-04-23 DIAGNOSIS — R2689 Other abnormalities of gait and mobility: Secondary | ICD-10-CM | POA: Diagnosis present

## 2016-04-23 DIAGNOSIS — M62830 Muscle spasm of back: Secondary | ICD-10-CM | POA: Insufficient documentation

## 2016-04-23 DIAGNOSIS — M542 Cervicalgia: Secondary | ICD-10-CM | POA: Diagnosis present
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
## 2016-04-23 NOTE — Patient Instructions (Signed)
CHIN TUCK - SUPINE  While lying on your back, tuck your chin towards your chest and press the back of your head into the table.  Maintain contact of head with the surface you are lying on the entire time.     x15 reps    DOUBLE KNEE TO CHEST STRETCH - DKTC  While Lying on your back,  hold your knees and gently pull them up towards your chest.    hold 10 sec, repeat 10 times   seated trunk rotation   Sit with you feet laced around the front legs of the chair now grab the right side of the chair with both hands and now pull your trunk and rotate your trunk to the right until your chest is pointing toward the right side now repeat on both sides    hold 5 sec, repeat 5 times.

## 2016-04-29 ENCOUNTER — Ambulatory Visit (HOSPITAL_COMMUNITY): Payer: BLUE CROSS/BLUE SHIELD | Admitting: Physical Therapy

## 2016-04-29 DIAGNOSIS — M62830 Muscle spasm of back: Secondary | ICD-10-CM

## 2016-04-29 DIAGNOSIS — R2689 Other abnormalities of gait and mobility: Secondary | ICD-10-CM

## 2016-04-29 DIAGNOSIS — M545 Low back pain, unspecified: Secondary | ICD-10-CM

## 2016-04-29 DIAGNOSIS — R293 Abnormal posture: Secondary | ICD-10-CM

## 2016-04-29 DIAGNOSIS — M542 Cervicalgia: Secondary | ICD-10-CM | POA: Diagnosis not present

## 2016-04-29 NOTE — Therapy (Signed)
[facility] [address] [place], [place], [address] Phone: [phone]   Fax:  [phone]  Physical Therapy Treatment  Patient Details  Name: Vanessa [name] L Arizola MRN: [id number] Date of Birth: 12/15/1965 Referring Provider: Avon Gullyesfaye Fanta, MD  Encounter Date: 04/29/2016      PT End of Session - 04/29/16 1450    Visit Number 2   Number of Visits 12   Date for PT Re-Evaluation 05/14/16   Authorization Type BCBS   Authorization Time Period 04/23/16 to 06/06/16   PT Start Time 1410  pt arrived late    PT Stop Time 1441   PT Time Calculation (min) 31 min   Activity Tolerance Patient tolerated treatment well   Behavior [facility] for tasks assessed/performed      Past Medical History:  Diagnosis Date  . Hypertension     Past Surgical History:  Procedure Laterality Date  . ABDOMINAL HYSTERECTOMY      There were no vitals filed for this visit.      Subjective Assessment - 04/29/16 1410    Subjective Pt reports that she was sore the day after her last session. Her pain is more on the Rt side of her neck today. She has been doing her exercises daily without any complaints.    Pertinent History HTN   How long can you sit comfortably? unlimited    How long can you stand comfortably? unsure    How long can you walk comfortably? 5-10 minutes (trip to the grocery store)   Diagnostic tests Xray- negative for fracture   Patient Stated Goals improve driving tolerance and get back to work    Currently in Pain? Yes   Pain Score 3    Pain Location Neck   Pain Orientation Right   Pain Descriptors / Indicators Sore   Pain Radiating Towards none    Pain Onset 1 to 4 weeks ago   Pain Frequency Intermittent   Aggravating Factors  cervical rotation/ flexion                          [facility] Adult PT Treatment/Exercise - 04/29/16 0001      Exercises   Exercises Neck     Neck Exercises: Supine   Neck Retraction 15  reps;3 secs   Neck Retraction Limitations tactile/verbal cues for technique    Other Supine Exercise BKTC 5x10 sec hold    Other Supine Exercise lower trunk rotation x20 reps      Neck Exercises: Prone   Other Prone Exercise scap retraction/depression x10 reps      Manual Therapy   Manual Therapy Myofascial release   Manual therapy comments separate rest of session    Myofascial Release TrP release Rt upper trap, STM cervical paraspinals      Neck Exercises: Stretches   Upper Trapezius Stretch 3 reps;20 seconds                PT Education - 04/29/16 1449    Education provided Yes   Education Details reviewed possible causes of cervical pain shift from Lt to Rt side; importance of HEP adherence even with increased soreness; addition to HEP   Person(s) Educated Patient   Methods Explanation;Demonstration;Handout;Verbal cues   Comprehension Verbalized understanding          PT Short Term Goals - 04/23/16 1524      PT SHORT TERM GOAL #1   Title Pt will demo  consistency and independence with HEP to improve pain and activity tolerance.    Time 1   Period Weeks   Status New     PT SHORT TERM GOAL #2   Title Pt will demo improved sitting posture awareness evident by her ability to correctly set up lumbar roll without cues from therapist.    Time 1   Period Weeks   Status New           PT Long Term Goals - 04/23/16 1525      PT LONG TERM GOAL #1   Title Pt will demo cervical ROM WNL and without report of pain to allow her to drive safely    Time 6   Period Weeks   Status New     PT LONG TERM GOAL #2   Title Pt will report no more than 3/10 pain with daily activity to improve her QOL and ability to return to work.    Time 6   Period Weeks   Status New     PT LONG TERM GOAL #3   Title Pt will report minor tenderness with palpation along her upper trap and cervical paraspinals to improve sitting tolerance.    Time 6   Period Weeks   Status New     PT  LONG TERM GOAL #4   Title Pt will demo lumbar ROM WNL and pain free, to allow her to perform ADLs without discomfort and difficulty.    Time 6   Period Weeks   Status New               Plan - 04/29/16 1454    Clinical Impression Statement Pt arrived late to today's appointment, but HEP was reviewed to ensure proper technique. Pt with decreased pain reported this visit and reporting adherence to her HEP. Manual techniques were performed with noted increased tenderness with palpation along Rt upper trap. Reminded pt of next appointment with verbalized agreement with time and date.    Rehab Potential Good   PT Frequency 2x / week   PT Duration 6 weeks   PT Treatment/Interventions ADLs/Self Care Home Management;Cryotherapy;Moist Heat;Therapeutic activities;Therapeutic exercise;Patient/family education;Neuromuscular re-education;Manual techniques;Taping;Dry needling;Passive range of motion   PT Next Visit Plan review chin tuck, progress to sitting next session if able; STM along upper trap/cervical paraspinals and lumbar paraspinals; postural strengthening    PT Home Exercise Plan lumbar roll, supine chin tucks, seated trunk rotation stretch, BKTC, upper trap stretch    Recommended Other Services none    Consulted and Agree with Plan of Care Patient      Patient will benefit from skilled therapeutic intervention in order to improve the following deficits and impairments:  Decreased mobility, Decreased range of motion, Decreased activity tolerance, Impaired flexibility, Postural dysfunction, Pain, Improper body mechanics, Increased muscle spasms  Visit Diagnosis: Cervicalgia  Bilateral low back pain without sciatica  Other abnormalities of gait and mobility  Muscle spasm of back  Abnormal posture     Problem List There are no active problems to display for this patient.   3:03 PM,04/29/16 Marylyn Ishihara PT, DPT [facility] [phone]  [facility] [address] [place], [place], [address] Phone: [phone]   Fax:  [phone]  Name: BREELYNN BANKERT MRN: [id number] Date of Birth: Aug 23, 1965

## 2016-04-30 ENCOUNTER — Ambulatory Visit (HOSPITAL_COMMUNITY): Payer: BLUE CROSS/BLUE SHIELD

## 2016-04-30 DIAGNOSIS — R2689 Other abnormalities of gait and mobility: Secondary | ICD-10-CM

## 2016-04-30 DIAGNOSIS — M545 Low back pain, unspecified: Secondary | ICD-10-CM

## 2016-04-30 DIAGNOSIS — M542 Cervicalgia: Secondary | ICD-10-CM

## 2016-04-30 DIAGNOSIS — R293 Abnormal posture: Secondary | ICD-10-CM

## 2016-04-30 DIAGNOSIS — M62830 Muscle spasm of back: Secondary | ICD-10-CM

## 2016-04-30 NOTE — Therapy (Signed)
[facility] [address] [place], [place], [address] Phone: [phone]   Fax:  [phone]  Physical Therapy Treatment  Patient Details  Name: Vanessa [name] L Chillemi MRN: [id number] Date of Birth: 16-Jun-1966 Referring Provider: Avon Gullyesfaye Fanta, MD  Encounter Date: 04/30/2016      PT End of Session - 04/30/16 1043    Visit Number 3   Number of Visits 12   Date for PT Re-Evaluation 05/14/16   Authorization Type BCBS   Authorization Time Period 04/23/16 to 06/06/16   PT Start Time 1035   PT Stop Time 1113   PT Time Calculation (min) 38 min      Past Medical History:  Diagnosis Date  . Hypertension     Past Surgical History:  Procedure Laterality Date  . ABDOMINAL HYSTERECTOMY      There were no vitals filed for this visit.      Subjective Assessment - 04/30/16 1030    Subjective Pt stated she has increased soreness and stiffness on Rt side of neck and lower back, pain scale 3/10 today.  Reports compliance with HEP daily.   Pertinent History HTN   Patient Stated Goals improve driving tolerance and get back to work    Currently in Pain? Yes   Pain Score 3    Pain Location Neck   Pain Orientation Right   Pain Descriptors / Indicators Sore  sore and stiff   Pain Type Acute pain   Pain Radiating Towards none   Pain Onset 1 to 4 weeks ago   Pain Frequency Intermittent   Aggravating Factors  cervical rotation/ flexion   Pain Relieving Factors muscle relaxor   Effect of Pain on Daily Activities limited tolerance to daily activity            [facility] Adult PT Treatment/Exercise - 04/30/16 0001      Neck Exercises: Seated   Neck Retraction 10 reps;5 secs   Neck Retraction Limitations tactile and verbal cueing   Cervical Rotation 10 reps   Cervical Rotation Limitations 3D cervical excursion   Postural Training Educated on importance of proper posture     Neck Exercises: Supine   Neck Retraction 15 reps;3 secs   Neck Retraction  Limitations tactile/verbal cues for technique    Other Supine Exercise BKTC 5x10 sec hold    Other Supine Exercise lower trunk rotation x20 reps      Manual Therapy   Manual Therapy Myofascial release   Manual therapy comments separate rest of session    Myofascial Release TrP release Rt upper trap, STM cervical and lumbar paraspinals      Neck Exercises: Stretches   Upper Trapezius Stretch 3 reps;20 seconds            PT Education - 04/29/16 1449    Education provided Yes   Education Details reviewed possible causes of cervical pain shift from Lt to Rt side; importance of HEP adherence even with increased soreness; addition to HEP   Person(s) Educated Patient   Methods Explanation;Demonstration;Handout;Verbal cues   Comprehension Verbalized understanding          PT Short Term Goals - 04/23/16 1524      PT SHORT TERM GOAL #1   Title Pt will demo consistency and independence with HEP to improve pain and activity tolerance.    Time 1   Period Weeks   Status New     PT SHORT TERM GOAL #2   Title Pt will demo improved  sitting posture awareness evident by her ability to correctly set up lumbar roll without cues from therapist.    Time 1   Period Weeks   Status New           PT Long Term Goals - 04/23/16 1525      PT LONG TERM GOAL #1   Title Pt will demo cervical ROM WNL and without report of pain to allow her to drive safely    Time 6   Period Weeks   Status New     PT LONG TERM GOAL #2   Title Pt will report no more than 3/10 pain with daily activity to improve her QOL and ability to return to work.    Time 6   Period Weeks   Status New     PT LONG TERM GOAL #3   Title Pt will report minor tenderness with palpation along her upper trap and cervical paraspinals to improve sitting tolerance.    Time 6   Period Weeks   Status New     PT LONG TERM GOAL #4   Title Pt will demo lumbar ROM WNL and pain free, to allow her to perform ADLs without discomfort  and difficulty.    Time 6   Period Weeks   Status New               Plan - 04/30/16 1050    Clinical Impression Statement Pt limited by stiffness and c/o soreness pain on Rt side of neck at entrance today.  Session focus on improving spinal mobility, posture awareness and manual technquies to reduce pain.  Began seated 3D cervical excursion for mobility and progressed to seated cervical retraction with verbal and tactile cueing for proper form.  Pt educated importance of proper posture to reduce strain on cervical musculature for pain control.  Ended session wiht manual STM/MFR to paraspinals and Rt upper traps to reduce overall tightness and reduce pain.   Rehab Potential Good   PT Frequency 2x / week   PT Duration 6 weeks   PT Treatment/Interventions ADLs/Self Care Home Management;Cryotherapy;Moist Heat;Therapeutic activities;Therapeutic exercise;Patient/family education;Neuromuscular re-education;Manual techniques;Taping;Dry needling;Passive range of motion   PT Next Visit Plan review form with chin tuck, continue to progress sitting next session if able; STM along upper trap/cervical paraspinals and lumbar paraspinals; postural strengthening    PT Home Exercise Plan lumbar roll, supine chin tucks, seated trunk rotation stretch, BKTC, upper trap stretch       Patient will benefit from skilled therapeutic intervention in order to improve the following deficits and impairments:  Decreased mobility, Decreased range of motion, Decreased activity tolerance, Impaired flexibility, Postural dysfunction, Pain, Improper body mechanics, Increased muscle spasms  Visit Diagnosis: Cervicalgia  Bilateral low back pain without sciatica  Other abnormalities of gait and mobility  Muscle spasm of back  Abnormal posture     Problem List There are no active problems to display for this patient.  [address], LPTA; CBIS [phone]  Juel Burrow 04/30/2016, 11:29 AM  [facility] [address] [place], [place], [address] Phone: [phone]   Fax:  [phone]  Name: TONISHA SILVEY MRN: [id number] Date of Birth: [birth year]

## 2016-05-06 ENCOUNTER — Ambulatory Visit (HOSPITAL_COMMUNITY): Payer: BLUE CROSS/BLUE SHIELD | Attending: Internal Medicine

## 2016-05-06 DIAGNOSIS — R2689 Other abnormalities of gait and mobility: Secondary | ICD-10-CM | POA: Diagnosis present

## 2016-05-06 DIAGNOSIS — M62830 Muscle spasm of back: Secondary | ICD-10-CM

## 2016-05-06 DIAGNOSIS — R293 Abnormal posture: Secondary | ICD-10-CM | POA: Diagnosis present

## 2016-05-06 DIAGNOSIS — M545 Low back pain: Secondary | ICD-10-CM | POA: Insufficient documentation

## 2016-05-06 DIAGNOSIS — M542 Cervicalgia: Secondary | ICD-10-CM

## 2016-05-06 NOTE — Therapy (Signed)
[facility] [address] [place], [place], [address] Phone: [phone]   Fax:  [phone]  Physical Therapy Treatment  Patient Details  Name: Vanessa [name] MRN: [id number] Date of Birth: March 27, 1966 Referring Provider: Avon Gully, MD  Encounter Date: 05/06/2016      PT End of Session - 05/06/16 1048    Visit Number 4   Number of Visits 12   Date for PT Re-Evaluation 05/14/16   Authorization Type BCBS   Authorization Time Period 04/23/16 to 06/06/16   PT Start Time 1034   PT Stop Time 1112   PT Time Calculation (min) 38 min   Activity Tolerance Patient tolerated treatment well   Behavior During Therapy [facility] for tasks assessed/performed      Past Medical History:  Diagnosis Date  . Hypertension     Past Surgical History:  Procedure Laterality Date  . ABDOMINAL HYSTERECTOMY      There were no vitals filed for this visit.      Subjective Assessment - 05/06/16 1036    Subjective Pt stated she had a migraine over weekend and did not complete any HEP over weekend due to headaches.  Reports stiffness in middle of lower back and Rt side of neck   Pertinent History HTN   Patient Stated Goals improve driving tolerance and get back to work    Currently in Pain? Yes   Pain Score 3    Pain Location Neck   Pain Orientation Right   Pain Descriptors / Indicators Sore  sore and stiff   Pain Type Acute pain   Pain Radiating Towards none   Pain Onset 1 to 4 weeks ago   Pain Frequency Intermittent   Aggravating Factors  cervical rotation/ flexion   Pain Relieving Factors muscle relaxor   Effect of Pain on Daily Activities limited tolerance to daily activiity                         [facility] Adult PT Treatment/Exercise - 05/06/16 0001      Neck Exercises: Seated   Neck Retraction 15 reps;5 secs   Neck Retraction Limitations verbal cueing   Postural Training Educated on importance of proper posture   Other Seated  Exercise 3D thoracic excursion 10x with UE movements     Neck Exercises: Supine   Neck Retraction 15 reps;3 secs   Neck Retraction Limitations tactile/verbal cues for technique    Other Supine Exercise BKTC 5x10 sec hold    Other Supine Exercise lower trunk rotation x20 reps      Neck Exercises: Prone   Other Prone Exercise quadruped: mad cat/camel 5x 10"   Other Prone Exercise quadruped UE rotation with eyes following 10x     Manual Therapy   Manual Therapy Myofascial release   Manual therapy comments separate rest of session    Myofascial Release TrP release Rt upper trap, STM cervical and lumbar paraspinals                   PT Short Term Goals - 04/23/16 1524      PT SHORT TERM GOAL #1   Title Pt will demo consistency and independence with HEP to improve pain and activity tolerance.    Time 1   Period Weeks   Status New     PT SHORT TERM GOAL #2   Title Pt will demo improved sitting posture awareness evident by her ability to correctly set  up lumbar roll without cues from therapist.    Time 1   Period Weeks   Status New           PT Long Term Goals - 04/23/16 1525      PT LONG TERM GOAL #1   Title Pt will demo cervical ROM WNL and without report of pain to allow her to drive safely    Time 6   Period Weeks   Status New     PT LONG TERM GOAL #2   Title Pt will report no more than 3/10 pain with daily activity to improve her QOL and ability to return to work.    Time 6   Period Weeks   Status New     PT LONG TERM GOAL #3   Title Pt will report minor tenderness with palpation along her upper trap and cervical paraspinals to improve sitting tolerance.    Time 6   Period Weeks   Status New     PT LONG TERM GOAL #4   Title Pt will demo lumbar ROM WNL and pain free, to allow her to perform ADLs without discomfort and difficulty.    Time 6   Period Weeks   Status New               Plan - 05/06/16 1049    Clinical Impression Statement Pt  limited by c/o spinal stiffness and overall soreness in neck and back today.  Added spinal mobility exercises and continued education on importance of posture through session.  Pt is improving form with less cueing required with cervical retraction though does continue to require cueing to improve awareness of posture.  EOS with manual MFR/STM to paraspinal and upper trap to reduce spasms/tightness and pain control.     Rehab Potential Good   PT Frequency 2x / week   PT Duration 6 weeks   PT Treatment/Interventions ADLs/Self Care Home Management;Cryotherapy;Moist Heat;Therapeutic activities;Therapeutic exercise;Patient/family education;Neuromuscular re-education;Manual techniques;Taping;Dry needling;Passive range of motion   PT Next Visit Plan Continue to address importance of proper posture and spinal mobility.  Next session add Wback, continue with seated chin tucks and progress as able; STM along upper trap/cervical paraspinals and lumbar paraspinals; postural strengthening    PT Home Exercise Plan lumbar roll, supine chin tucks, seated trunk rotation stretch, BKTC, upper trap stretch       Patient will benefit from skilled therapeutic intervention in order to improve the following deficits and impairments:  Decreased mobility, Decreased range of motion, Decreased activity tolerance, Impaired flexibility, Postural dysfunction, Pain, Improper body mechanics, Increased muscle spasms  Visit Diagnosis: Cervicalgia  Other abnormalities of gait and mobility  Muscle spasm of back  Abnormal posture     Problem List There are no active problems to display for this patient.  [address]Bonnie Roig, LPTA; CBIS [phone]  Juel BurrowCockerham, Gavriel Holzhauer Jo 05/06/2016, 11:21 AM  [facility] [address] [place], [place], [address] Phone: [phone]   Fax:  [phone]  Name: Lynett Fishamela L Balster MRN: [id number] Date of Birth: 07/26/1966

## 2016-05-08 ENCOUNTER — Ambulatory Visit (HOSPITAL_COMMUNITY): Payer: BLUE CROSS/BLUE SHIELD

## 2016-05-08 DIAGNOSIS — M545 Low back pain, unspecified: Secondary | ICD-10-CM

## 2016-05-08 DIAGNOSIS — R293 Abnormal posture: Secondary | ICD-10-CM

## 2016-05-08 DIAGNOSIS — M542 Cervicalgia: Secondary | ICD-10-CM | POA: Diagnosis not present

## 2016-05-08 DIAGNOSIS — M62830 Muscle spasm of back: Secondary | ICD-10-CM

## 2016-05-08 DIAGNOSIS — R2689 Other abnormalities of gait and mobility: Secondary | ICD-10-CM

## 2016-05-08 NOTE — Therapy (Signed)
[facility] [address] [place], [place], [address] Phone: [phone]   Fax:  [phone]  Physical Therapy Treatment  Patient Details  Name: Vanessa [name] MRN: [id number] Date of Birth: 19-Sep-1965 Referring Provider: Avon Gully, MD  Encounter Date: 05/08/2016      PT End of Session - 05/08/16 0959    Visit Number 5   Number of Visits 12   Date for PT Re-Evaluation 05/14/16   Authorization Type BCBS   Authorization Time Period 04/23/16 to 06/06/16   PT Start Time 0948   PT Stop Time 1027   PT Time Calculation (min) 39 min   Activity Tolerance Patient tolerated treatment well   Behavior During Therapy [facility] for tasks assessed/performed      Past Medical History:  Diagnosis Date  . Hypertension     Past Surgical History:  Procedure Laterality Date  . ABDOMINAL HYSTERECTOMY      There were no vitals filed for this visit.      Subjective Assessment - 05/08/16 0954    Subjective Pt stated she drank lots of water following last session and got a minor headache following last session.  Reports pain minimal on Rt side of neck and lower back, stated she feels stiff following sleeping in same position in the mornings.   Pertinent History HTN   Patient Stated Goals improve driving tolerance and get back to work    Currently in Pain? Yes   Pain Score 2   1.5/10 Rt neck and lower back   Pain Location Neck   Pain Orientation Right   Pain Descriptors / Indicators Sore;Aching  Stiffness   Pain Type Acute pain   Pain Radiating Towards none   Pain Onset 1 to 4 weeks ago   Pain Frequency Intermittent   Aggravating Factors  cervical rotation/flexion   Pain Relieving Factors muscle relaxor   Effect of Pain on Daily Activities limited tolerance to ADLs                         [facility] Adult PT Treatment/Exercise - 05/08/16 0001      Neck Exercises: Seated   Neck Retraction 15 reps;5 secs   Neck Retraction  Limitations verbal cueing   X to V 10 reps   W Back Limitations 10   Other Seated Exercise 3D thoracic excursion 10x with UE movements     Neck Exercises: Prone   Other Prone Exercise quadruped: mad cat/camel 5x 10"   Other Prone Exercise quadruped UE rotation with eyes following 10x; Cervical retraction 10 x5" wtih verbal and tactile cueing initially     Manual Therapy   Manual Therapy Myofascial release   Manual therapy comments separate rest of session    Myofascial Release Rt upper trap, rhomboid, cervical and lumbar paraspinals and Rt QL                  PT Short Term Goals - 04/23/16 1524      PT SHORT TERM GOAL #1   Title Pt will demo consistency and independence with HEP to improve pain and activity tolerance.    Time 1   Period Weeks   Status New     PT SHORT TERM GOAL #2   Title Pt will demo improved sitting posture awareness evident by her ability to correctly set up lumbar roll without cues from therapist.    Time 1   Period Weeks   Status  New           PT Long Term Goals - 04/23/16 1525      PT LONG TERM GOAL #1   Title Pt will demo cervical ROM WNL and without report of pain to allow her to drive safely    Time 6   Period Weeks   Status New     PT LONG TERM GOAL #2   Title Pt will report no more than 3/10 pain with daily activity to improve her QOL and ability to return to work.    Time 6   Period Weeks   Status New     PT LONG TERM GOAL #3   Title Pt will report minor tenderness with palpation along her upper trap and cervical paraspinals to improve sitting tolerance.    Time 6   Period Weeks   Status New     PT LONG TERM GOAL #4   Title Pt will demo lumbar ROM WNL and pain free, to allow her to perform ADLs without discomfort and difficulty.    Time 6   Period Weeks   Status New               Plan - 05/08/16 1001    Clinical Impression Statement Pt stated she continues to have increased stiffness and soreness morning  time, feels may be related to pillow and position.  Reviewed supportive positions to sleep in and encouraged to possibly purchase a more supportive pillow.  Session foucs on postural strengthening and spinal mobility, pt is improving form with less cueing required to improve cervical retraction and improve overall awareness of posture in seated position.  Added posture strengthening exercises with min cueing required.  Ended session with manual techniques to reduce trigger points on Rt upper trap and QL, noted spasms on Rt Rhomboid, able to reduce 50% though unable to fully resolved.  No reports of pain through session.  Encouraged pt to stay hydrated following manual to reduce headaches.     Rehab Potential Good   PT Frequency 2x / week   PT Duration 6 weeks   PT Treatment/Interventions ADLs/Self Care Home Management;Cryotherapy;Moist Heat;Therapeutic activities;Therapeutic exercise;Patient/family education;Neuromuscular re-education;Manual techniques;Taping;Dry needling;Passive range of motion   PT Next Visit Plan Continue to address importance of proper posture and spinal mobility. Continue seated/quadruped chin tucks and progress as able; Next session begin therband and UE/LE quadruped for lumbar stability; STM along upper trap/cervical paraspinals and lumbar paraspinals; postural strengthening    PT Home Exercise Plan lumbar roll, supine chin tucks, seated trunk rotation stretch, BKTC, upper trap stretch       Patient will benefit from skilled therapeutic intervention in order to improve the following deficits and impairments:  Decreased mobility, Decreased range of motion, Decreased activity tolerance, Impaired flexibility, Postural dysfunction, Pain, Improper body mechanics, Increased muscle spasms  Visit Diagnosis: Cervicalgia  Other abnormalities of gait and mobility  Muscle spasm of back  Abnormal posture  Bilateral low back pain without sciatica, unspecified  chronicity     Problem List There are no active problems to display for this patient.  [address]Vanessa [name], LPTA; CBIS [phone]  Juel [name], Vanessa [name] 05/08/2016, 10:32 AM  [facility] [address] [place], [place], [address] Phone: [phone]   Fax:  [phone]  Name: Vanessa [name] MRN: [id number] Date of Birth: June 01, 1966

## 2016-05-12 ENCOUNTER — Ambulatory Visit (HOSPITAL_COMMUNITY): Payer: BLUE CROSS/BLUE SHIELD | Admitting: Physical Therapy

## 2016-05-12 ENCOUNTER — Telehealth (HOSPITAL_COMMUNITY): Payer: Self-pay | Admitting: Physical Therapy

## 2016-05-12 DIAGNOSIS — M542 Cervicalgia: Secondary | ICD-10-CM | POA: Diagnosis not present

## 2016-05-12 DIAGNOSIS — M62830 Muscle spasm of back: Secondary | ICD-10-CM

## 2016-05-12 DIAGNOSIS — R293 Abnormal posture: Secondary | ICD-10-CM

## 2016-05-12 DIAGNOSIS — R2689 Other abnormalities of gait and mobility: Secondary | ICD-10-CM

## 2016-05-12 NOTE — Therapy (Signed)
[facility] [address] [place], [place], [address] Phone: [phone]   Fax:  [phone]  Physical Therapy Treatment  Patient Details  Name: Vanessa [name] MRN: [id number] Date of Birth: [birth year] Referring Provider: Avon Gully, MD  Encounter Date: 05/12/2016      PT End of Session - 05/12/16 1602    Visit Number 6   Number of Visits 12   Date for PT Re-Evaluation 05/14/16   Authorization Type BCBS   Authorization Time Period 04/23/16 to 06/06/16   PT Start Time 1524   PT Stop Time 1602   PT Time Calculation (min) 38 min   Activity Tolerance Patient tolerated treatment well   Behavior During Therapy [facility] for tasks assessed/performed      Past Medical History:  Diagnosis Date  . Hypertension     Past Surgical History:  Procedure Laterality Date  . ABDOMINAL HYSTERECTOMY      There were no vitals filed for this visit.      Subjective Assessment - 05/12/16 1529    Subjective PT states she is getting better.  Currently 2/10 pain in neck and her back is really not bothering her any longer.  Completing exercises in shower mostly.    Currently in Pain? Yes   Pain Score 2    Pain Location Neck   Pain Orientation Right   Pain Descriptors / Indicators Aching                         [facility] Adult PT Treatment/Exercise - 05/12/16 0001      Neck Exercises: Theraband   Scapula Retraction 10 reps;Red   Shoulder Extension 10 reps;Red   Rows 10 reps;Red     Neck Exercises: Seated   Neck Retraction 15 reps;5 secs   Neck Retraction Limitations verbal cueing   X to V 10 reps   W Back Limitations 10 with 2# each     Neck Exercises: Prone   Other Prone Exercise quadruped: mad cat/camel 5x 10"   Other Prone Exercise quadruped UE rotation with eyes following 10x; Cervical retraction 10 x5" wtih verbal and tactile cueing initially     Manual Therapy   Manual Therapy Myofascial release   Manual therapy comments  separate rest of session    Myofascial Release bilateral upper trap, rhomboid, Lt lateral neck                   PT Short Term Goals - 04/23/16 1524      PT SHORT TERM GOAL #1   Title Pt will demo consistency and independence with HEP to improve pain and activity tolerance.    Time 1   Period Weeks   Status New     PT SHORT TERM GOAL #2   Title Pt will demo improved sitting posture awareness evident by her ability to correctly set up lumbar roll without cues from therapist.    Time 1   Period Weeks   Status New           PT Long Term Goals - 04/23/16 1525      PT LONG TERM GOAL #1   Title Pt will demo cervical ROM WNL and without report of pain to allow her to drive safely    Time 6   Period Weeks   Status New     PT LONG TERM GOAL #2   Title Pt will report no more than 3/10 pain  with daily activity to improve her QOL and ability to return to work.    Time 6   Period Weeks   Status New     PT LONG TERM GOAL #3   Title Pt will report minor tenderness with palpation along her upper trap and cervical paraspinals to improve sitting tolerance.    Time 6   Period Weeks   Status New     PT LONG TERM GOAL #4   Title Pt will demo lumbar ROM WNL and pain free, to allow her to perform ADLs without discomfort and difficulty.    Time 6   Period Weeks   Status New               Plan - 05/12/16 1603    Clinical Impression Statement Continued with focus on improving posture and decreasing pain and spasm.  Pt with most complaints on Lt side today vs Rt.  Noted spasm in Lt upper trap and general tightness in surrounding musculuature bilaterally.  Able to reduce 80% of tighness with patient reporting no pain at end of session.  Added postural theraband exercises today with cues for form.    Rehab Potential Good   PT Frequency 2x / week   PT Duration 6 weeks   PT Treatment/Interventions ADLs/Self Care Home Management;Cryotherapy;Moist Heat;Therapeutic  activities;Therapeutic exercise;Patient/family education;Neuromuscular re-education;Manual techniques;Taping;Dry needling;Passive range of motion   PT Next Visit Plan Continue to address importance of proper posture and spinal mobility. Continue seated/quadruped chin tucks and progress as able; Next session begin UE/LE quadruped for lumbar stability; STM along upper trap/cervical paraspinals and lumbar paraspinals; postural strengthening    PT Home Exercise Plan lumbar roll, supine chin tucks, seated trunk rotation stretch, BKTC, upper trap stretch       Patient will benefit from skilled therapeutic intervention in order to improve the following deficits and impairments:  Decreased mobility, Decreased range of motion, Decreased activity tolerance, Impaired flexibility, Postural dysfunction, Pain, Improper body mechanics, Increased muscle spasms  Visit Diagnosis: Cervicalgia  Other abnormalities of gait and mobility  Muscle spasm of back  Abnormal posture     Problem List There are no active problems to display for this patient.   Lurena NidaFrazier, Chalice Philbert B 05/12/2016, 4:07 PM  [facility] [address] [place], [place], [address] Phone: [phone]   Fax:  [phone]  Name: Vanessa [name] MRN: [id number] Date of Birth: 1966-07-05

## 2016-05-12 NOTE — Telephone Encounter (Signed)
Pt agreed to come in today due to SK not being here tomorrow. NF

## 2016-05-13 ENCOUNTER — Ambulatory Visit (HOSPITAL_COMMUNITY): Payer: BLUE CROSS/BLUE SHIELD | Admitting: Physical Therapy

## 2016-05-15 ENCOUNTER — Ambulatory Visit (HOSPITAL_COMMUNITY): Payer: BLUE CROSS/BLUE SHIELD | Admitting: Physical Therapy

## 2016-05-15 DIAGNOSIS — M542 Cervicalgia: Secondary | ICD-10-CM

## 2016-05-15 DIAGNOSIS — M545 Low back pain, unspecified: Secondary | ICD-10-CM

## 2016-05-15 DIAGNOSIS — R2689 Other abnormalities of gait and mobility: Secondary | ICD-10-CM

## 2016-05-15 DIAGNOSIS — M62830 Muscle spasm of back: Secondary | ICD-10-CM

## 2016-05-15 DIAGNOSIS — R293 Abnormal posture: Secondary | ICD-10-CM
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
## 2016-05-20 ENCOUNTER — Encounter (HOSPITAL_COMMUNITY): Payer: BLUE CROSS/BLUE SHIELD

## 2016-05-22 ENCOUNTER — Encounter (HOSPITAL_COMMUNITY): Payer: BLUE CROSS/BLUE SHIELD

## 2016-05-27 ENCOUNTER — Encounter (HOSPITAL_COMMUNITY): Payer: BLUE CROSS/BLUE SHIELD | Admitting: Physical Therapy

## 2016-10-20 ENCOUNTER — Other Ambulatory Visit (HOSPITAL_COMMUNITY): Payer: Self-pay | Admitting: Internal Medicine

## 2016-10-20 DIAGNOSIS — Z1231 Encounter for screening mammogram for malignant neoplasm of breast: Secondary | ICD-10-CM

## 2016-10-23 ENCOUNTER — Ambulatory Visit: Payer: BLUE CROSS/BLUE SHIELD | Admitting: Cardiology

## 2016-10-27 ENCOUNTER — Telehealth: Payer: Self-pay | Admitting: Cardiovascular Disease

## 2016-10-27 ENCOUNTER — Ambulatory Visit (INDEPENDENT_AMBULATORY_CARE_PROVIDER_SITE_OTHER): Payer: BLUE CROSS/BLUE SHIELD | Admitting: Cardiovascular Disease

## 2016-10-27 ENCOUNTER — Encounter: Payer: Self-pay | Admitting: Cardiovascular Disease

## 2016-10-27 ENCOUNTER — Encounter: Payer: Self-pay | Admitting: *Deleted

## 2016-10-27 VITALS — BP 118/79 | HR 51 | Ht 67.0 in | Wt 136.8 lb

## 2016-10-27 DIAGNOSIS — Z8249 Family history of ischemic heart disease and other diseases of the circulatory system: Secondary | ICD-10-CM | POA: Diagnosis not present

## 2016-10-27 DIAGNOSIS — R9431 Abnormal electrocardiogram [ECG] [EKG]: Secondary | ICD-10-CM

## 2016-10-27 DIAGNOSIS — R079 Chest pain, unspecified: Secondary | ICD-10-CM

## 2016-10-27 DIAGNOSIS — I10 Essential (primary) hypertension: Secondary | ICD-10-CM

## 2016-10-27 MED ORDER — NITROGLYCERIN 0.4 MG SL SUBL: 0.4000 mg | SUBLINGUAL_TABLET | SUBLINGUAL | 3 refills | Status: DC | PRN

## 2016-10-27 NOTE — Progress Notes (Signed)
CARDIOLOGY CONSULT NOTE  Patient ID: Vanessa [name] MRN: [id number] DOB/AGE: October 29, 1965 51 y.o.  Admit date: (Not on file) Primary Physician: Avon Gully, MD Referring Physician:   Reason for Consultation: chest pain  HPI: The patient is a 51 year old woman woman who is referred for the evaluation of chest pain. She has a history of hypertension.   She has been experiencing chest pain both with and without exertion over the past 3 weeks. It is located in the left upper chest and in the left inframammary region. It occurred when she was walking in a store and while she is sitting down at home. It is described as sharp and lasting seconds. It is associated with shortness of breath and palpitations and lightheadedness. She denies syncope. She says "it brings me to halt ".  She says she is under a lot of stress both at home and at work.  ECG performed in the office today demonstrates sinus rhythm with diffuse nonspecific T wave abnormalities.  Fam Hx: Mother died at 10 of heart disease.     Not on File  Current Outpatient Prescriptions  Medication Sig Dispense Refill  . cyclobenzaprine (FLEXERIL) 5 MG tablet Take 1 tablet (5 mg total) by mouth 3 (three) times daily as needed (muscle soreness). (Patient taking differently: Take 10 mg by mouth 3 (three) times daily as needed (muscle soreness). ) 30 tablet 0  . lisinopril (PRINIVIL,ZESTRIL) 5 MG tablet Take 5 mg by mouth at bedtime.    . naproxen (NAPROSYN) 500 MG tablet Take 1 tablet (500 mg total) by mouth 2 (two) times daily. 30 tablet 0   No current facility-administered medications for this visit.     Past Medical History:  Diagnosis Date  . Arthritis   . Hypertension   . Iron deficiency anemia   . Migraine     Past Surgical History:  Procedure Laterality Date  . ABDOMINAL HYSTERECTOMY    . ECTOPIC PREGNANCY SURGERY      Social History   Social History  . Marital status: Married    Spouse name: N/A  . Number  of children: N/A  . Years of education: N/A   Occupational History  . Not on file.   Social History Main Topics  . Smoking status: Never Smoker  . Smokeless tobacco: Never Used  . Alcohol use No  . Drug use: No  . Sexual activity: Not on file   Other Topics Concern  . Not on file   Social History Narrative  . No narrative on file      Prior to Admission medications   Medication Sig Start Date End Date Taking? Authorizing Provider  aspirin-acetaminophen-caffeine (EXCEDRIN MIGRAINE) [phone] MG tablet Take by mouth every 6 (six) hours as needed for headache.    Historical Provider, MD  cyclobenzaprine (FLEXERIL) 5 MG tablet Take 1 tablet (5 mg total) by mouth 3 (three) times daily as needed (muscle soreness). Patient taking differently: Take 10 mg by mouth 3 (three) times daily as needed (muscle soreness).  04/12/16   Devoria Albe, MD  diclofenac (VOLTAREN) 75 MG EC tablet Take 75 mg by mouth 2 (two) times daily.    Historical Provider, MD  lisinopril (PRINIVIL,ZESTRIL) 5 MG tablet Take 5 mg by mouth at bedtime.    Historical Provider, MD  naproxen (NAPROSYN) 500 MG tablet Take 1 tablet (500 mg total) by mouth 2 (two) times daily. 04/12/16   Devoria Albe, MD     Review of systems complete  and found to be negative unless listed above in HPI     Physical exam Blood pressure 118/79, pulse (!) 51, height 5\' 7"  (1.702 m), weight 136 lb 12.8 oz (62.1 kg), SpO2 100 %. General: NAD Neck: No JVD, no thyromegaly or thyroid nodule.  Lungs: Clear to auscultation bilaterally with normal respiratory effort. CV: Nondisplaced PMI. Regular rate and rhythm, normal S1/S2, no S3/S4, no murmur.  No peripheral edema.  No carotid bruit.  Normal pedal pulses.  Abdomen: Soft, nontender, no hepatosplenomegaly, no distention.  Skin: Intact without lesions or rashes.  Neurologic: Alert and oriented x 3.  Psych: Normal affect. Extremities: No clubbing or cyanosis.  HEENT: Normal.   ECG: Most recent ECG  reviewed.  Telemetry: Independently reviewed.  Labs:   Lab Results  Component Value Date   WBC 5.1 08/16/2012   HGB 13.3 08/16/2012   HCT 39.2 08/16/2012   MCV 79.2 08/16/2012   PLT 181 08/16/2012   No results for input(s): NA, K, CL, CO2, BUN, CREATININE, CALCIUM, PROT, BILITOT, ALKPHOS, ALT, AST, GLUCOSE in the last 168 hours.  Invalid input(s): LABALBU No results found for: CKTOTAL, CKMB, CKMBINDEX, TROPONINI No results found for: CHOL No results found for: HDL No results found for: LDLCALC No results found for: TRIG No results found for: CHOLHDL No results found for: LDLDIRECT       Studies: No results found.  ASSESSMENT AND PLAN:  1. Chest pain with abnormal ECG: Symptoms have been more frequent in the past few weeks. She has a family history of premature coronary artery disease. Typical and atypical characteristics of her symptoms. I will check a lipid panel and HbA1c to assess additional cardiovascular risk factors. I will prescribe nitroglycerin. I will proceed with a nuclear myocardial perfusion imaging study to evaluate for ischemic heart disease (exercise Myoview). I will order a 2-D echocardiogram with Doppler to evaluate cardiac structure, function, and regional wall motion.  2. Hypertension: Controlled. No changes.   Dispo: fu 3 weeks.   Signed: Prentice DockerSuresh Koneswaran, M.D., F.A.C.C.  10/27/2016, 9:24 AM

## 2016-10-27 NOTE — Patient Instructions (Signed)
Your physician recommends that you schedule a follow-up appointment in: 3 WEEKS WITH DR. Purvis Sheffield  Your physician has recommended you make the following change in your medication:   TAKE NITROGLYCERIN UNDER THE TONGUE AS NEEDED FOR CHEST PAIN - SEE INFO   Your physician has requested that you have en exercise stress myoview. For further information please visit [email or web address] Please follow instruction sheet, as given.  Your physician recommends that you return for lab work - PLEASE FAST PRIOR TO LAB WORK - HGBA1C/LIPIDS  Thank you for choosing [facility]!!  Nitroglycerin sublingual tablets What is this medicine? NITROGLYCERIN (nye troe GLI ser in) is a type of vasodilator. It relaxes blood vessels, increasing the blood and oxygen supply to your heart. This medicine is used to relieve chest pain caused by angina. It is also used to prevent chest pain before activities like climbing stairs, going outdoors in cold weather, or sexual activity. This medicine may be used for other purposes; ask your health care provider or pharmacist if you have questions. COMMON BRAND NAME(S): Nitroquick, Nitrostat, Nitrotab What should I tell my health care provider before I take this medicine? They need to know if you have any of these conditions: -anemia -head injury, recent stroke, or bleeding in the brain -liver disease -previous heart attack -an unusual or allergic reaction to nitroglycerin, other medicines, foods, dyes, or preservatives -pregnant or trying to get pregnant -breast-feeding How should I use this medicine? Take this medicine by mouth as needed. At the first sign of an angina attack (chest pain or tightness) place one tablet under your tongue. You can also take this medicine 5 to 10 minutes before an event likely to produce chest pain. Follow the directions on the prescription label. Let the tablet dissolve under the tongue. Do not swallow whole. Replace the dose if you  accidentally swallow it. It will help if your mouth is not dry. Saliva around the tablet will help it to dissolve more quickly. Do not eat or drink, smoke or chew tobacco while a tablet is dissolving. If you are not better within 5 minutes after taking ONE dose of nitroglycerin, call 9-1-1 immediately to seek emergency medical care. Do not take more than 3 nitroglycerin tablets over 15 minutes. If you take this medicine often to relieve symptoms of angina, your doctor or health care professional may provide you with different instructions to manage your symptoms. If symptoms do not go away after following these instructions, it is important to call 9-1-1 immediately. Do not take more than 3 nitroglycerin tablets over 15 minutes. Talk to your pediatrician regarding the use of this medicine in children. Special care may be needed. Overdosage: If you think you have taken too much of this medicine contact a poison control center or emergency room at once. NOTE: This medicine is only for you. Do not share this medicine with others. What if I miss a dose? This does not apply. This medicine is only used as needed. What may interact with this medicine? Do not take this medicine with any of the following medications: -certain migraine medicines like ergotamine and dihydroergotamine (DHE) -medicines used to treat erectile dysfunction like sildenafil, tadalafil, and vardenafil -riociguat This medicine may also interact with the following medications: -alteplase -aspirin -heparin -medicines for high blood pressure -medicines for mental depression -other medicines used to treat angina -phenothiazines like chlorpromazine, mesoridazine, prochlorperazine, thioridazine This list may not describe all possible interactions. Give your health care provider a list of all the medicines,  herbs, non-prescription drugs, or dietary supplements you use. Also tell them if you smoke, drink alcohol, or use illegal drugs. Some  items may interact with your medicine. What should I watch for while using this medicine? Tell your doctor or health care professional if you feel your medicine is no longer working. Keep this medicine with you at all times. Sit or lie down when you take your medicine to prevent falling if you feel dizzy or faint after using it. Try to remain calm. This will help you to feel better faster. If you feel dizzy, take several deep breaths and lie down with your feet propped up, or bend forward with your head resting between your knees. You may get drowsy or dizzy. Do not drive, use machinery, or do anything that needs mental alertness until you know how this drug affects you. Do not stand or sit up quickly, especially if you are an older patient. This reduces the risk of dizzy or fainting spells. Alcohol can make you more drowsy and dizzy. Avoid alcoholic drinks. Do not treat yourself for coughs, colds, or pain while you are taking this medicine without asking your doctor or health care professional for advice. Some ingredients may increase your blood pressure. What side effects may I notice from receiving this medicine? Side effects that you should report to your doctor or health care professional as soon as possible: -blurred vision -dry mouth -skin rash -sweating -the feeling of extreme pressure in the head -unusually weak or tired Side effects that usually do not require medical attention (report to your doctor or health care professional if they continue or are bothersome): -flushing of the face or neck -headache -irregular heartbeat, palpitations -nausea, vomiting This list may not describe all possible side effects. Call your doctor for medical advice about side effects. You may report side effects to FDA at 1-800-FDA-1088. Where should I keep my medicine? Keep out of the reach of children. Store at room temperature between 20 and 25 degrees C (68 and 77 degrees F). Store in Orthoptistoriginal  container. Protect from light and moisture. Keep tightly closed. Throw away any unused medicine after the expiration date. NOTE: This sheet is a summary. It may not cover all possible information. If you have questions about this medicine, talk to your doctor, pharmacist, or health care provider.  2018 Elsevier/Gold Standard (2013-05-19 17:57:36)

## 2016-10-27 NOTE — Telephone Encounter (Signed)
EXERCISE MYOVIEW [facility] November 03, 2016

## 2016-10-30 ENCOUNTER — Telehealth: Payer: Self-pay | Admitting: *Deleted

## 2016-10-30 NOTE — Telephone Encounter (Signed)
Notes recorded by Lesle ChrisAngela G Kateri Balch, LPN on 1/61/09603/29/2018 at 5:22 PM EDT Patient notified. Copy to pmd. Follow up scheduled for 11/21/2016 with Dr. Purvis SheffieldKoneswaran. ------  Notes recorded by Laqueta LindenSuresh A Koneswaran, MD on 10/29/2016 at 4:39 PM EDT Good results.

## 2016-11-03 ENCOUNTER — Encounter (HOSPITAL_COMMUNITY): Payer: Self-pay

## 2016-11-03 ENCOUNTER — Inpatient Hospital Stay (HOSPITAL_COMMUNITY): Admission: RE | Admit: 2016-11-03 | Payer: BLUE CROSS/BLUE SHIELD | Source: Ambulatory Visit

## 2016-11-03 ENCOUNTER — Encounter (HOSPITAL_COMMUNITY)
Admission: RE | Admit: 2016-11-03 | Discharge: 2016-11-03 | Disposition: A | Discharge status: Home / Self Care | Payer: BLUE CROSS/BLUE SHIELD | Source: Ambulatory Visit | Attending: Cardiology | Admitting: Cardiology

## 2016-11-03 ENCOUNTER — Other Ambulatory Visit: Payer: Self-pay | Admitting: *Deleted

## 2016-11-03 ENCOUNTER — Telehealth: Payer: Self-pay | Admitting: Cardiovascular Disease

## 2016-11-03 DIAGNOSIS — R079 Chest pain, unspecified: Secondary | ICD-10-CM

## 2016-11-03 LAB — NM MYOCAR MULTI W/SPECT W/WALL MOTION / EF
CHL CUP NUCLEAR SSS: 1
CHL RATE OF PERCEIVED EXERTION: 15
CSEPED: 4 min
Estimated workload: 7 METS
Exercise duration (sec): 26 s
LV sys vol: 15 mL
LVDIAVOL: 56 mL (ref 46–106)
MPHR: 170 {beats}/min
Peak HR: 157 {beats}/min
Percent HR: 92 %
RATE: 0.29
Rest HR: 47 {beats}/min
SDS: 1
SRS: 0
TID: 1.03

## 2016-11-03 MED ORDER — SODIUM CHLORIDE 0.9% FLUSH
INTRAVENOUS | Status: AC
  Administered 2016-11-03: 10 mL via INTRAVENOUS
  Filled 2016-11-03: qty 10

## 2016-11-03 MED ORDER — TECHNETIUM TC 99M TETROFOSMIN IV KIT
10.0000 | PACK | Freq: Once | INTRAVENOUS | Status: AC | PRN
  Administered 2016-11-03: 10 via INTRAVENOUS

## 2016-11-03 MED ORDER — TECHNETIUM TC 99M TETROFOSMIN IV KIT
30.0000 | PACK | Freq: Once | INTRAVENOUS | Status: AC | PRN
  Administered 2016-11-03: 30 via INTRAVENOUS

## 2016-11-03 MED ORDER — REGADENOSON 0.4 MG/5ML IV SOLN
INTRAVENOUS | Status: AC
  Filled 2016-11-03: qty 5

## 2016-11-03 NOTE — Telephone Encounter (Signed)
Order entered into EPIC & scheduler [facility]) made aware.

## 2016-11-03 NOTE — Telephone Encounter (Signed)
Pre-cert Verification for the following procedure   Echo scheduled for 11/14/16 at [facility].

## 2016-11-03 NOTE — Telephone Encounter (Signed)
Per last office note Dr. Purvis Sheffield ordered I will order a 2-D echocardiogram with Doppler to evaluate cardiac structure, function, and regional wall motion. Order was not put in.  She is requesting to have it done 330pm or 11/14/16 due to work schedule.

## 2016-11-14 ENCOUNTER — Ambulatory Visit (HOSPITAL_COMMUNITY)
Admission: RE | Admit: 2016-11-14 | Discharge: 2016-11-14 | Disposition: A | Discharge status: Home / Self Care | Payer: BLUE CROSS/BLUE SHIELD | Source: Ambulatory Visit | Attending: Cardiovascular Disease | Admitting: Cardiovascular Disease

## 2016-11-14 DIAGNOSIS — I10 Essential (primary) hypertension: Secondary | ICD-10-CM | POA: Insufficient documentation

## 2016-11-14 DIAGNOSIS — R079 Chest pain, unspecified: Secondary | ICD-10-CM | POA: Diagnosis not present

## 2016-11-14 NOTE — Progress Notes (Signed)
*  PRELIMINARY RESULTS* Echocardiogram 2D Echocardiogram has been performed.  Vanessa [name] 11/14/2016, 9:08 AM

## 2016-11-17 ENCOUNTER — Encounter: Payer: Self-pay | Admitting: *Deleted

## 2016-11-21 ENCOUNTER — Encounter: Payer: Self-pay | Admitting: Cardiovascular Disease

## 2016-11-21 ENCOUNTER — Ambulatory Visit (INDEPENDENT_AMBULATORY_CARE_PROVIDER_SITE_OTHER): Payer: BLUE CROSS/BLUE SHIELD | Admitting: Cardiovascular Disease

## 2016-11-21 ENCOUNTER — Ambulatory Visit (HOSPITAL_COMMUNITY)
Admission: RE | Admit: 2016-11-21 | Discharge: 2016-11-21 | Disposition: A | Discharge status: Home / Self Care | Payer: BLUE CROSS/BLUE SHIELD | Source: Ambulatory Visit | Attending: Internal Medicine | Admitting: Internal Medicine

## 2016-11-21 VITALS — BP 138/80 | HR 63 | Ht 67.0 in | Wt 139.0 lb

## 2016-11-21 DIAGNOSIS — Z8249 Family history of ischemic heart disease and other diseases of the circulatory system: Secondary | ICD-10-CM | POA: Diagnosis not present

## 2016-11-21 DIAGNOSIS — Z1231 Encounter for screening mammogram for malignant neoplasm of breast: Secondary | ICD-10-CM | POA: Diagnosis not present

## 2016-11-21 DIAGNOSIS — I10 Essential (primary) hypertension: Secondary | ICD-10-CM

## 2016-11-21 DIAGNOSIS — R079 Chest pain, unspecified: Secondary | ICD-10-CM | POA: Diagnosis not present

## 2016-11-21 DIAGNOSIS — F411 Generalized anxiety disorder: Secondary | ICD-10-CM | POA: Diagnosis not present

## 2016-11-21 DIAGNOSIS — R9431 Abnormal electrocardiogram [ECG] [EKG]: Secondary | ICD-10-CM

## 2016-11-21 NOTE — Patient Instructions (Signed)
Medication Instructions:  Your physician recommends that you continue on your current medications as directed. Please refer to the Current Medication list given to you today.  Labwork: NONE  Testing/Procedures: NONE  Follow-Up: Your physician recommends that you schedule a follow-up appointment in: AS NEEDED WITH DR. Purvis Sheffield  Any Other Special Instructions Will Be Listed Below (If Applicable).  If you need a refill on your cardiac medications before your next appointment, please call your pharmacy.

## 2016-11-21 NOTE — Progress Notes (Signed)
      SUBJECTIVE: The patient returns for follow-up after undergoing cardiovascular testing performed for the evaluation of chest pain.  Normal nuclear stress test 11/03/16.  Echocardiogram was normal, LVEF 55-60%.  She tells me she is very anxious and gets very emotional, and has not spoken to her PCP about this yet.   Review of Systems: As per "subjective", otherwise negative.  No Known Allergies  Current Outpatient Prescriptions  Medication Sig Dispense Refill  . cyclobenzaprine (FLEXERIL) 5 MG tablet Take 1 tablet (5 mg total) by mouth 3 (three) times daily as needed (muscle soreness). (Patient taking differently: Take 10 mg by mouth 3 (three) times daily as needed (muscle soreness). ) 30 tablet 0  . lisinopril (PRINIVIL,ZESTRIL) 5 MG tablet Take 5 mg by mouth at bedtime.    . naproxen (NAPROSYN) 500 MG tablet Take 1 tablet (500 mg total) by mouth 2 (two) times daily. 30 tablet 0  . nitroGLYCERIN (NITROSTAT) 0.4 MG SL tablet Place 1 tablet (0.4 mg total) under the tongue every 5 (five) minutes as needed for chest pain. 25 tablet 3   No current facility-administered medications for this visit.     Past Medical History:  Diagnosis Date  . Arthritis   . Hypertension   . Iron deficiency anemia   . Migraine     Past Surgical History:  Procedure Laterality Date  . ABDOMINAL HYSTERECTOMY    . ECTOPIC PREGNANCY SURGERY      Social History   Social History  . Marital status: Married    Spouse name: N/A  . Number of children: N/A  . Years of education: N/A   Occupational History  . Not on file.   Social History Main Topics  . Smoking status: Never Smoker  . Smokeless tobacco: Never Used  . Alcohol use No  . Drug use: No  . Sexual activity: Not on file   Other Topics Concern  . Not on file   Social History Narrative  . No narrative on file     Vitals:   11/21/16 1533  BP: 138/80  Pulse: 63  Weight: 139 lb (63 kg)  Height:  (1.702 m)    Wt  Readings from Last 3 Encounters:  11/21/16 139 lb (63 kg)  10/27/16 136 lb 12.8 oz (62.1 kg)  10/20/16 138 lb (62.6 kg)     PHYSICAL EXAM General: NAD HEENT: Normal. Neck: No JVD, no thyromegaly. Lungs: Clear to auscultation bilaterally with normal respiratory effort. CV: Nondisplaced PMI.  Regular rate and rhythm, normal S1/S2, no S3/S4, no murmur. No pretibial or periankle edema.  No carotid bruit.   Abdomen: Soft, nontender, no distention.  Neurologic: Alert and oriented.  Psych: Normal affect. Skin: Normal. Musculoskeletal: No gross deformities.    ECG: Most recent ECG reviewed.   Labs: Lab Results  Component Value Date/Time   K 3.8 08/16/2012 04:08 AM   BUN 9 08/16/2012 04:08 AM   CREATININE 0.98 08/16/2012 04:08 AM   ALT 8 08/16/2012 04:08 AM   HGB 13.3 08/16/2012 04:08 AM     Lipids: No results found for: LDLCALC, LDLDIRECT, CHOL, TRIG, HDL     ASSESSMENT AND PLAN: 1. Chest pain: Noncardiac. Normal nuclear stress test. Normal LV function. Likely due to anxiety and stress.  2. Hypertension: Controlled. No changes.  3. Generalized anxiety disorder: Encouraged to speak with PCP regarding this so she can get the help she needs.   Disposition: Follow up prn  Prentice Docker, M.D., F.A.C.C.

## 2016-11-24 ENCOUNTER — Emergency Department (HOSPITAL_COMMUNITY)
Admission: EM | Admit: 2016-11-24 | Discharge: 2016-11-24 | Disposition: A | Discharge status: Home / Self Care | Payer: BLUE CROSS/BLUE SHIELD | Attending: Emergency Medicine | Admitting: Emergency Medicine

## 2016-11-24 ENCOUNTER — Encounter (HOSPITAL_COMMUNITY): Payer: Self-pay | Admitting: Emergency Medicine

## 2016-11-24 DIAGNOSIS — M79632 Pain in left forearm: Secondary | ICD-10-CM | POA: Insufficient documentation

## 2016-11-24 DIAGNOSIS — W57XXXA Bitten or stung by nonvenomous insect and other nonvenomous arthropods, initial encounter: Secondary | ICD-10-CM | POA: Diagnosis not present

## 2016-11-24 DIAGNOSIS — S50861A Insect bite (nonvenomous) of right forearm, initial encounter: Secondary | ICD-10-CM | POA: Diagnosis present

## 2016-11-24 DIAGNOSIS — S59911A Unspecified injury of right forearm, initial encounter: Secondary | ICD-10-CM | POA: Diagnosis not present

## 2016-11-24 DIAGNOSIS — I10 Essential (primary) hypertension: Secondary | ICD-10-CM | POA: Diagnosis not present

## 2016-11-24 DIAGNOSIS — Y990 Civilian activity done for income or pay: Secondary | ICD-10-CM | POA: Diagnosis not present

## 2016-11-24 DIAGNOSIS — Y939 Activity, unspecified: Secondary | ICD-10-CM | POA: Diagnosis not present

## 2016-11-24 DIAGNOSIS — Y929 Unspecified place or not applicable: Secondary | ICD-10-CM | POA: Diagnosis not present

## 2016-11-24 NOTE — ED Triage Notes (Signed)
PT states she was at work and felt a stinging sensation to right inner forearm today and went to the health occupation nurse and the nurse applied a cream. No swelling or redness noted at this time.

## 2016-11-24 NOTE — ED Notes (Signed)
Pulses and sensation present, pt able to grip without difficulty.

## 2016-11-24 NOTE — ED Provider Notes (Signed)
[facility]-EMERGENCY DEPT Provider Note   CSN: [id number] Arrival date & time: 11/24/16  1704  By signing my name below, I, Cynda Acres, attest that this documentation has been prepared under the direction and in the presence of [employer]. Electronically Signed: Cynda Acres, Scribe. 11/24/16. 7:34 PM.  History   Chief Complaint Chief Complaint  Patient presents with  . Insect Bite    HPI Comments: Vanessa [name] is a 51 y.o. female with a history of hypertension, who presents to the Emergency Department complaining of sudden-onset, constant redness and swelling to the right forearm that began at 3:30 PM today. Patient states she stuck her hand in a bin at work, when she felt a stinging sensation to her right inner forearm. Patient states she feels as if she was bit by an insect. Patient reports going to the health occupation nurse, where an unknown cream was applied. Patient reports associated pain to the left forearm. Patient reports minimal relief with cream applied. Patient denies any decreased range of motion, shortness of breath, trouble swallowing, fever, or chills.   The history is provided by the patient. No language interpreter was used.    Past Medical History:  Diagnosis Date  . Arthritis   . Hypertension   . Iron deficiency anemia   . Migraine     There are no active problems to display for this patient.   Past Surgical History:  Procedure Laterality Date  . ABDOMINAL HYSTERECTOMY    . ECTOPIC PREGNANCY SURGERY      OB History    No data available       Home Medications    Prior to Admission medications   Medication Sig Start Date End Date Taking? Authorizing Provider  cyclobenzaprine (FLEXERIL) 5 MG tablet Take 1 tablet (5 mg total) by mouth 3 (three) times daily as needed (muscle soreness). Patient taking differently: Take 10 mg by mouth 3 (three) times daily as needed (muscle soreness).  04/12/16   Devoria Albe, MD  lisinopril (PRINIVIL,ZESTRIL) 5 MG  tablet Take 5 mg by mouth at bedtime.    Historical Provider, MD  naproxen (NAPROSYN) 500 MG tablet Take 1 tablet (500 mg total) by mouth 2 (two) times daily. 04/12/16   Devoria Albe, MD  nitroGLYCERIN (NITROSTAT) 0.4 MG SL tablet Place 1 tablet (0.4 mg total) under the tongue every 5 (five) minutes as needed for chest pain. 10/27/16   Laqueta Linden, MD    Family History Family History  Problem Relation Age of Onset  . High blood pressure Mother   . Coronary artery disease Mother   . Hypertension Mother   . Heart attack Mother   . Hypertension Sister     Social History Social History  Substance Use Topics  . Smoking status: Never Smoker  . Smokeless tobacco: Never Used  . Alcohol use No     Allergies   Patient has no known allergies.   Review of Systems Review of Systems  Constitutional: Negative for chills and fever.  HENT: Negative for trouble swallowing.   Respiratory: Negative for shortness of breath.   Musculoskeletal: Positive for arthralgias (left forearm).  Skin: Positive for color change (Ecchymosis of the right forearm).  All other systems reviewed and are negative.    Physical Exam Updated Vital Signs BP (!) 154/82 (BP Location: Right Arm)   Pulse 60   Temp 98.2 F (36.8 C) (Oral)   Resp 18   Ht  (1.702 m)   Wt 139  lb (63 kg)   SpO2 100%   BMI 21.77 kg/m   Physical Exam  Constitutional: She is oriented to person, place, and time. She appears well-developed.  HENT:  Head: Normocephalic and atraumatic.  Mouth/Throat: Oropharynx is clear and moist.  No facial swelling appreciated.   Eyes: Conjunctivae and EOM are normal. Pupils are equal, round, and reactive to light.  Neck: Normal range of motion. Neck supple.  Cardiovascular: Normal rate and regular rhythm.  Exam reveals no gallop and no friction rub.   No murmur heard. Pulmonary/Chest: Effort normal and breath sounds normal. No stridor. She has no wheezes. She has no rales.  Abdominal:  Soft. Bowel sounds are normal.  Musculoskeletal: Normal range of motion. She exhibits no tenderness.  Capillary refill is less than two seconds. Radial pulse is 2+. There is a 2.6 area of ecchymosis on the palmer surface of the right forearm. There is tenderness of the bruised area. Mild swelling of the bruised area. NO stinger noted under magnification. Full range of motion of the right wrist. Full range of motion of the right elbow. Full range of motion of the right shoulder.   Neurological: She is alert and oriented to person, place, and time.  Skin: Skin is warm and dry.  Psychiatric: She has a normal mood and affect.  Nursing note and vitals reviewed.    ED Treatments / Results  DIAGNOSTIC STUDIES: Oxygen Saturation is 100% on RA, normal by my interpretation.    COORDINATION OF CARE: 7:33 PM Discussed treatment plan with pt at bedside and pt agreed to plan, which includes an ice pack, Claritin, and benadryl.    Labs (all labs ordered are listed, but only abnormal results are displayed) Labs Reviewed - No data to display  EKG  EKG Interpretation None       Radiology No results found.  Procedures Procedures (including critical care time)  Medications Ordered in ED Medications - No data to display   Initial Impression / Assessment and Plan / ED Course  I have reviewed the triage vital signs and the nursing notes.  Pertinent labs & imaging results that were available during my care of the patient were reviewed by me and considered in my medical decision making (see chart for details).     *I have reviewed nursing notes, vital signs, and all appropriate lab and imaging results for this patient.**  Final Clinical Impressions(s) / ED Diagnoses   MDM:  Bite vs Bruise to the right forearm, no evidence of anaphylactic changes. No neurovascular changes. Pt will be asked to use an ice pack as well as benadryl and Claritin. She is to return if there are any changes or  problems.   Final diagnoses:  Injury, forearm, right, initial encounter    New Prescriptions New Prescriptions   No medications on file   **I personally performed the services described in this documentation, which was scribed in my presence. The recorded information has been reviewed and is accurate.    Ivery Quale, PA-C 11/25/16 1607    Samuel Jester, DO 11/28/16 1536

## 2016-11-24 NOTE — Discharge Instructions (Signed)
Please apply ice to the bruises/pain area of your wrist. Use Claritin each morning, may use Benadryl at night for itching or burning sensation. Please see Dr. Felecia Shelling for additional evaluation if any changes or problems. Return to the emergency department if any emergent changes or problems.

## 2017-02-12 ENCOUNTER — Encounter: Payer: Self-pay | Admitting: Orthopaedic Surgery

## 2017-02-12 ENCOUNTER — Ambulatory Visit (INDEPENDENT_AMBULATORY_CARE_PROVIDER_SITE_OTHER): Payer: BLUE CROSS/BLUE SHIELD | Admitting: Orthopaedic Surgery

## 2017-02-12 ENCOUNTER — Ambulatory Visit (INDEPENDENT_AMBULATORY_CARE_PROVIDER_SITE_OTHER): Payer: BLUE CROSS/BLUE SHIELD

## 2017-02-12 VITALS — BP 141/89 | HR 49 | Temp 97.9°F | Ht 67.25 in | Wt 137.0 lb

## 2017-02-12 DIAGNOSIS — G8929 Other chronic pain: Secondary | ICD-10-CM | POA: Diagnosis not present

## 2017-02-12 DIAGNOSIS — M542 Cervicalgia: Secondary | ICD-10-CM

## 2017-02-12 DIAGNOSIS — M545 Low back pain, unspecified: Secondary | ICD-10-CM

## 2017-02-12 MED ORDER — DICLOFENAC SODIUM 75 MG PO TBEC: 75.0000 mg | DELAYED_RELEASE_TABLET | Freq: Two times a day (BID) | ORAL | 2 refills | Status: DC
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
## 2017-02-12 NOTE — Addendum Note (Signed)
Addended by: Earnstine RegalKEELING, JOHN W on: 02/12/2017 04:08 PM   Modules accepted: Orders

## 2017-02-24 ENCOUNTER — Ambulatory Visit (HOSPITAL_COMMUNITY): Payer: BLUE CROSS/BLUE SHIELD | Attending: Orthopaedic Surgery | Admitting: Physical Therapy

## 2017-02-24 DIAGNOSIS — M545 Low back pain, unspecified: Secondary | ICD-10-CM

## 2017-02-24 DIAGNOSIS — M542 Cervicalgia: Secondary | ICD-10-CM

## 2017-02-24 DIAGNOSIS — M6281 Muscle weakness (generalized): Secondary | ICD-10-CM | POA: Diagnosis present

## 2017-02-24 DIAGNOSIS — R293 Abnormal posture: Secondary | ICD-10-CM | POA: Diagnosis present

## 2017-02-24 NOTE — Patient Instructions (Signed)
   SINGLE KNEE TO CHEST STRETCH - SKTC  While Lying on your back, hold your knee and gently pull it up towards your chest.  Hold for 10 seconds and relax.  Repeat 5 times each side, 2 times per day.     Lumbar Rotations   Lying on your back with your knees bent, slowly drop your legs to one side and hold the stretch. Come back to the middle and switch sides. You should feel the stretch in your back on the opposite side that your legs are leaning.  Hold for 10 seconds then switch sides.  Repeat 5 times each side, 2 times per day.     Seated Piriformis Stretch  1. While sitting on a chair with your back unsupported, cross the left leg over the right and hinge slightly at the waist.  2. Use your left hand to push down onto the left knee until you feel a deep stretch. This should not be painful.  3. Hold this position for 30 seconds, breathing deeply. Repeat on the opposite side.  Repeat 3 times each leg, twice a day.     RETRACTION / CHIN TUCK  Slowly draw your head back so that your ears line up with your shoulders.  Hold for 3 seconds.  Repeat 10-15 times, 2 times per day.    UPPER TRAP STRETCH - HOLDING CHAIR  While sitting in a chair, hold the seat with one hand and bend your head towards the opposite side for a gentle stretch to the side of the neck.  Hold for 30 seconds, then repeat on the other side.  Repeat 2 times each side, 2 times a day.
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
## 2017-02-25 ENCOUNTER — Encounter: Payer: Self-pay | Admitting: Orthopaedic Surgery

## 2017-02-25 ENCOUNTER — Telehealth: Payer: Self-pay | Admitting: Orthopaedic Surgery

## 2017-02-25 NOTE — Telephone Encounter (Signed)
Patient is asking if you will write her a note for work to only work 40 hours a week. Per patient she went to PT yesterday and is to have PT on Tuesdays and Thursdays.She rescheduled her return appointment with you from tomorrow afternoon  to 8/7 morning.  Please advise

## 2017-02-25 NOTE — Telephone Encounter (Signed)
You can give note she requested.

## 2017-02-26 ENCOUNTER — Ambulatory Visit: Payer: BLUE CROSS/BLUE SHIELD | Admitting: Orthopaedic Surgery

## 2017-03-03 ENCOUNTER — Ambulatory Visit (HOSPITAL_COMMUNITY): Payer: BLUE CROSS/BLUE SHIELD

## 2017-03-03 DIAGNOSIS — M545 Low back pain, unspecified: Secondary | ICD-10-CM

## 2017-03-03 DIAGNOSIS — M542 Cervicalgia: Secondary | ICD-10-CM | POA: Diagnosis not present

## 2017-03-03 DIAGNOSIS — M6281 Muscle weakness (generalized): Secondary | ICD-10-CM

## 2017-03-03 DIAGNOSIS — R293 Abnormal posture: Secondary | ICD-10-CM
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
## 2017-03-05 ENCOUNTER — Ambulatory Visit (HOSPITAL_COMMUNITY): Payer: BLUE CROSS/BLUE SHIELD | Attending: Orthopaedic Surgery

## 2017-03-05 DIAGNOSIS — M545 Low back pain, unspecified: Secondary | ICD-10-CM

## 2017-03-05 DIAGNOSIS — M542 Cervicalgia: Secondary | ICD-10-CM | POA: Diagnosis present

## 2017-03-05 DIAGNOSIS — R293 Abnormal posture: Secondary | ICD-10-CM | POA: Diagnosis present

## 2017-03-05 DIAGNOSIS — M62830 Muscle spasm of back: Secondary | ICD-10-CM | POA: Diagnosis present

## 2017-03-05 DIAGNOSIS — R2689 Other abnormalities of gait and mobility: Secondary | ICD-10-CM | POA: Insufficient documentation

## 2017-03-05 DIAGNOSIS — M6281 Muscle weakness (generalized): Secondary | ICD-10-CM | POA: Insufficient documentation
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
## 2017-03-09 ENCOUNTER — Telehealth (HOSPITAL_COMMUNITY): Payer: Self-pay | Admitting: Internal Medicine

## 2017-03-09 NOTE — Telephone Encounter (Signed)
6/18  pt came by and cx because she said she got stung by a bee in her back and it is really sore

## 2017-03-10 ENCOUNTER — Ambulatory Visit (HOSPITAL_COMMUNITY): Payer: BLUE CROSS/BLUE SHIELD

## 2017-03-10 ENCOUNTER — Ambulatory Visit (INDEPENDENT_AMBULATORY_CARE_PROVIDER_SITE_OTHER): Payer: BLUE CROSS/BLUE SHIELD | Admitting: Orthopaedic Surgery

## 2017-03-10 ENCOUNTER — Encounter: Payer: Self-pay | Admitting: Orthopaedic Surgery

## 2017-03-10 VITALS — BP 126/79 | HR 75 | Temp 97.5°F | Ht 67.25 in | Wt 140.0 lb

## 2017-03-10 DIAGNOSIS — M545 Low back pain: Secondary | ICD-10-CM | POA: Diagnosis not present

## 2017-03-10 DIAGNOSIS — M542 Cervicalgia: Secondary | ICD-10-CM

## 2017-03-10 DIAGNOSIS — G8929 Other chronic pain: Secondary | ICD-10-CM | POA: Diagnosis not present
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
## 2017-03-12 ENCOUNTER — Telehealth (HOSPITAL_COMMUNITY): Payer: Self-pay | Admitting: Internal Medicine

## 2017-03-12 ENCOUNTER — Ambulatory Visit (HOSPITAL_COMMUNITY): Payer: BLUE CROSS/BLUE SHIELD

## 2017-03-12 NOTE — Telephone Encounter (Signed)
03/12/17 I called to cx because Vanessa [name] was leaving.... Patient said she had already called to cx on 8/8 but was told the computers were down and someone was supposed to have wirtten it down so they could cancel the appt.

## 2017-03-17 ENCOUNTER — Ambulatory Visit (HOSPITAL_COMMUNITY): Payer: BLUE CROSS/BLUE SHIELD | Admitting: Physical Therapy

## 2017-03-17 DIAGNOSIS — M6281 Muscle weakness (generalized): Secondary | ICD-10-CM

## 2017-03-17 DIAGNOSIS — M62830 Muscle spasm of back: Secondary | ICD-10-CM

## 2017-03-17 DIAGNOSIS — M542 Cervicalgia: Secondary | ICD-10-CM | POA: Diagnosis not present

## 2017-03-17 DIAGNOSIS — R2689 Other abnormalities of gait and mobility: Secondary | ICD-10-CM

## 2017-03-17 DIAGNOSIS — R293 Abnormal posture: Secondary | ICD-10-CM

## 2017-03-17 DIAGNOSIS — M545 Low back pain, unspecified: Secondary | ICD-10-CM

## 2017-03-17 NOTE — Therapy (Signed)
[facility] [address] [place], [place], [address] Phone: [phone]   Fax:  [phone]  Physical Therapy Treatment  Patient Details  Name: Vanessa [name] MRN: [id number] Date of Birth: 08-07-1965 Referring Provider: Darreld Mclean   Encounter Date: 03/17/2017      PT End of Session - 03/17/17 1708    Visit Number 4   Number of Visits 13   Date for PT Re-Evaluation 03/17/17   Authorization Type [employer] [place]    Authorization Time Period 02/24/17 to 04/07/17   PT Start Time 1608   PT Stop Time 1651   PT Time Calculation (min) 43 min   Activity Tolerance Patient tolerated treatment well   Behavior During Therapy [facility] for tasks assessed/performed      Past Medical History:  Diagnosis Date  . Arthritis   . Hypertension   . Iron deficiency anemia   . Migraine     Past Surgical History:  Procedure Laterality Date  . ABDOMINAL HYSTERECTOMY    . ECTOPIC PREGNANCY SURGERY      There were no vitals filed for this visit.      Subjective Assessment - 03/17/17 1613    Subjective PT states she missed all last week due to conflicts and getting stung by a bee.  States she is not doing well with increased cervical and lumbar pain.  neck pain 5/10, Rt cervical pain and lumbar is 3/10.   Reports she has not been complaint with her HEP due to her bee sting in her back that flaired up her whole muscle in her back.    Currently in Pain? Yes   Pain Score 3    Pain Location Back   Pain Orientation Lower   Pain Descriptors / Indicators Sore   Pain Type Chronic pain   Multiple Pain Sites Yes   Pain Score 5   Pain Location Neck   Pain Orientation Right   Pain Descriptors / Indicators Guarding;Sore                         [facility] Adult PT Treatment/Exercise - 03/17/17 0001      Lumbar Exercises: Stretches   Active Hamstring Stretch 2 reps;30 seconds   Active Hamstring Stretch Limitations supine   Single Knee to Chest  Stretch 2 reps;30 seconds   Lower Trunk Rotation 10 seconds   Lower Trunk Rotation Limitations Supine LTR 10x 10"     Lumbar Exercises: Seated   Other Seated Lumbar Exercises cervical excursions 5 reps each, scap retractions 10 reps   Other Seated Lumbar Exercises Chin tucks 10x 5" with "L" for mechanics     Lumbar Exercises: Supine   Ab Set 10 reps;5 seconds   Bent Knee Raise 10 reps;5 seconds   Bent Knee Raise Limitations with ab set   Bridge 10 reps   Straight Leg Raise 5 reps     Manual Therapy   Manual Therapy Soft tissue mobilization   Manual therapy comments separate rest of session    Soft tissue mobilization seated Rt cervical>Lt                   PT Short Term Goals - 02/24/17 1747      PT SHORT TERM GOAL #1   Title Patient to demonstrate full cervical and lumbar ROM as being pain free in order to improve QOL and job tolerance    Time 3   Period Weeks  Status New   Target Date 03/17/17     PT SHORT TERM GOAL #2   Title patient to demonstrate correct posture 75% of the time without cues in order to reduce pain and demonstrate improved core strength    Time 3   Period Weeks   Status New     PT SHORT TERM GOAL #3   Title Patient to demonstrate resolution of piriformis flexiblity impairments in order to reduce and prevent futher potential sciatic nerve irritation potentially contributing to symptoms    Time 3   Period Weeks   Status New     PT SHORT TERM GOAL #4   Title patient to be compliant with correct performance of HEP, to be updated PRN    Time 1   Period Weeks   Status New   Target Date 03/03/17           PT Long Term Goals - 02/24/17 1750      PT LONG TERM GOAL #1   Title Patient to demonstrate functional strength as being at least 4/5 in order to reduce pain and improve work tolerance    Time 6   Period Weeks   Status New   Target Date 04/07/17     PT LONG TERM GOAL #2   Title Pt will report no more than 1/10 pain with daily  activity in her back and cervical region to improve her QOL and work tolerance    Time 6   Period Weeks   Status New     PT LONG TERM GOAL #3   Title Paitent to demonstrate correct functional lifting mechanics in order to demonstrate improved functional muscle strength and to prevent recurrence of pain at work    Time 6   Period Weeks   Status New     PT LONG TERM GOAL #4   Title Patient to be compliant with correct performance of advanced HEP in order to maintain functional gains after being discharged from PT   Time 6   Period Weeks   Status New               Plan - 03/17/17 1708    Clinical Impression Statement Continued with focus on lumbar and cervical mobiltiy.  Discussed postural alignment and began scapular retraction in seated positioning.  PT wtih good form completing these and chin tucks.  Good recall of logroll technique without cues needed when transitioning supine to sit.   Focused manual on cervical area this session as with more pain and guarding noted today.  pt with large trigger spasm in Rt UT that was reduced signifciantly wtih manual.    Clinical Impairments Affecting Rehab Potential (+) young age, non-complicated PMH (-) exacerbation of symptoms after ending PT last year    PT Frequency 2x / week   PT Duration 6 weeks   PT Treatment/Interventions ADLs/Self Care Home Management;Cryotherapy;Moist Heat;DME Instruction;Gait training;Stair training;Functional mobility training;Therapeutic activities;Therapeutic exercise;Balance training;Neuromuscular re-education;Patient/family education;Manual techniques;Dry needling   PT Next Visit Plan Next session begin postural theraband exericses.  Cotinue with  postural training, cervical stretches, manual, core and proximal strengthening.    PT Home Exercise Plan Eval: SKTC, lumbar rotations, piriformis stretch, chin tucks, upper trap stretch       Patient will benefit from skilled therapeutic intervention in order to  improve the following deficits and impairments:  Improper body mechanics, Pain, Decreased coordination, Decreased mobility, Increased muscle spasms, Postural dysfunction, Decreased activity tolerance, Decreased strength, Decreased balance, Impaired flexibility  Visit Diagnosis: Cervicalgia  Bilateral low back pain without sciatica, unspecified chronicity  Muscle weakness (generalized)  Abnormal posture  Other abnormalities of gait and mobility  Muscle spasm of back     Problem List There are no active problems to display for this patient.  Lurena Nidamy B Taliyah Watrous, PTA/CLT [phone]  Lurena NidaFrazier, Rainbow Salman B 03/17/2017, 5:12 PM  [facility] [address] [place], [place], [address] Phone: [phone]   Fax:  [phone]  Name: Vanessa [name] MRN: [id number] Date of Birth: Oct 28, 1965

## 2017-03-19 ENCOUNTER — Other Ambulatory Visit (HOSPITAL_COMMUNITY): Payer: Self-pay | Admitting: Family Medicine

## 2017-03-19 ENCOUNTER — Ambulatory Visit (HOSPITAL_COMMUNITY): Payer: BLUE CROSS/BLUE SHIELD | Admitting: Physical Therapy

## 2017-03-19 DIAGNOSIS — M542 Cervicalgia: Secondary | ICD-10-CM | POA: Diagnosis not present

## 2017-03-19 DIAGNOSIS — M545 Low back pain, unspecified: Secondary | ICD-10-CM

## 2017-03-19 DIAGNOSIS — M62830 Muscle spasm of back: Secondary | ICD-10-CM

## 2017-03-19 DIAGNOSIS — R293 Abnormal posture: Secondary | ICD-10-CM

## 2017-03-19 DIAGNOSIS — M6281 Muscle weakness (generalized): Secondary | ICD-10-CM

## 2017-03-19 DIAGNOSIS — R2689 Other abnormalities of gait and mobility: Secondary | ICD-10-CM

## 2017-03-19 DIAGNOSIS — M7989 Other specified soft tissue disorders: Secondary | ICD-10-CM

## 2017-03-19 NOTE — Therapy (Signed)
[facility] [address] [place], [place], [address] Phone: [phone]   Fax:  [phone]  Physical Therapy Treatment  Patient Details  Name: Vanessa [name] MRN: [id number] Date of Birth: 1965-08-14 Referring Provider: Darreld Mclean   Encounter Date: 03/19/2017      PT End of Session - 03/19/17 1638    Visit Number 5   Number of Visits 13   Date for PT Re-Evaluation 03/17/17   Authorization Type [employer] [place]    Authorization Time Period 02/24/17 to 04/07/17   PT Start Time 1604   PT Stop Time 1650   PT Time Calculation (min) 46 min   Activity Tolerance Patient tolerated treatment well   Behavior During Therapy [facility] for tasks assessed/performed      Past Medical History:  Diagnosis Date  . Arthritis   . Hypertension   . Iron deficiency anemia   . Migraine     Past Surgical History:  Procedure Laterality Date  . ABDOMINAL HYSTERECTOMY    . ECTOPIC PREGNANCY SURGERY      There were no vitals filed for this visit.      Subjective Assessment - 03/19/17 1610    Subjective Pt just getting off 6-2:30 shift today and reports fatigue.  Overall neck is feeling better with 1/10 pain reported.  Lumbar is 3/10 pain today.   Currently in Pain? Yes   Pain Score 3    Pain Location Back   Pain Orientation Lower;Medial   Pain Descriptors / Indicators Aching;Sore   Pain Type Chronic pain   Pain Score 1   Pain Location Neck   Pain Descriptors / Indicators Sore                         [facility] Adult PT Treatment/Exercise - 03/19/17 0001      Lumbar Exercises: Stretches   Active Hamstring Stretch 2 reps;30 seconds   Active Hamstring Stretch Limitations standing with 12" step   Prone on Elbows Stretch 1 rep   Prone on Elbows Stretch Limitations 2 minutes     Lumbar Exercises: Standing   Scapular Retraction Both;10 reps   Theraband Level (Scapular Retraction) Level 3 (Green)   Row Both;10 reps;Theraband   Theraband Level (Row) Level 3 (Green)   Shoulder Extension Both;10 reps;Theraband   Theraband Level (Shoulder Extension) Level 3 (Green)     Lumbar Exercises: Supine   Ab Set 15 reps   Bridge 10 reps   Bridge Limitations 2 sets with abd sets   Other Supine Lumbar Exercises Supine LTR 10x 10"   Other Supine Lumbar Exercises cervical retractions into pillow 10 reps     Lumbar Exercises: Sidelying   Hip Abduction 10 reps     Lumbar Exercises: Prone   Straight Leg Raise 10 reps     Manual Therapy   Manual Therapy Soft tissue mobilization   Manual therapy comments separate rest of session    Soft tissue mobilization prone lumbar paraspinals                  PT Short Term Goals - 02/24/17 1747      PT SHORT TERM GOAL #1   Title Patient to demonstrate full cervical and lumbar ROM as being pain free in order to improve QOL and job tolerance    Time 3   Period Weeks   Status New   Target Date 03/17/17     PT SHORT TERM  GOAL #2   Title patient to demonstrate correct posture 75% of the time without cues in order to reduce pain and demonstrate improved core strength    Time 3   Period Weeks   Status New     PT SHORT TERM GOAL #3   Title Patient to demonstrate resolution of piriformis flexiblity impairments in order to reduce and prevent futher potential sciatic nerve irritation potentially contributing to symptoms    Time 3   Period Weeks   Status New     PT SHORT TERM GOAL #4   Title patient to be compliant with correct performance of HEP, to be updated PRN    Time 1   Period Weeks   Status New   Target Date 03/03/17           PT Long Term Goals - 02/24/17 1750      PT LONG TERM GOAL #1   Title Patient to demonstrate functional strength as being at least 4/5 in order to reduce pain and improve work tolerance    Time 6   Period Weeks   Status New   Target Date 04/07/17     PT LONG TERM GOAL #2   Title Pt will report no more than 1/10 pain with daily  activity in her back and cervical region to improve her QOL and work tolerance    Time 6   Period Weeks   Status New     PT LONG TERM GOAL #3   Title Paitent to demonstrate correct functional lifting mechanics in order to demonstrate improved functional muscle strength and to prevent recurrence of pain at work    Time 6   Period Weeks   Status New     PT LONG TERM GOAL #4   Title Patient to be compliant with correct performance of advanced HEP in order to maintain functional gains after being discharged from PT   Time 6   Period Weeks   Status New               Plan - 03/19/17 1638    Clinical Impression Statement PT wtih noted improvement in cervical mobiltiy.  Began theraband postural strengthening exericises with overall good form and minimal cues.   Focused on lumbar this session with addition of therex to promote lumbar stabilization and stregnthening of weak hip musculauture.  completed manual to lumbar musculature in prone at EOS with good relief voiced.  General tightness but no spasms palpated.  Overall relief with diminished lumbar pain at end of session.     Clinical Impairments Affecting Rehab Potential (+) young age, non-complicated PMH (-) exacerbation of symptoms after ending PT last year    PT Frequency 2x / week   PT Duration 6 weeks   PT Treatment/Interventions ADLs/Self Care Home Management;Cryotherapy;Moist Heat;DME Instruction;Gait training;Stair training;Functional mobility training;Therapeutic activities;Therapeutic exercise;Balance training;Neuromuscular re-education;Patient/family education;Manual techniques;Dry needling   PT Next Visit Plan Continue with  postural training, cervical stretches, manual, core and proximal strengthening.      PT Home Exercise Plan Eval: SKTC, lumbar rotations, piriformis stretch, chin tucks, upper trap stretch       Patient will benefit from skilled therapeutic intervention in order to improve the following deficits and  impairments:  Improper body mechanics, Pain, Decreased coordination, Decreased mobility, Increased muscle spasms, Postural dysfunction, Decreased activity tolerance, Decreased strength, Decreased balance, Impaired flexibility  Visit Diagnosis: Cervicalgia  Bilateral low back pain without sciatica, unspecified chronicity  Muscle weakness (generalized)  Abnormal posture  Other abnormalities of gait and mobility  Muscle spasm of back     Problem List There are no active problems to display for this patient.   Vanessa [name], PTA/CLT [phone]   Vanessa [name], Vanessa [name] 03/19/2017, 5:10 PM  [facility] [address] [place], [place], [address] Phone: [phone]   Fax:  [phone]  Name: Vanessa [name] MRN: [id number] Date of Birth: 03/10/1966

## 2017-03-23 ENCOUNTER — Ambulatory Visit (HOSPITAL_COMMUNITY)
Admission: RE | Admit: 2017-03-23 | Discharge: 2017-03-23 | Disposition: A | Discharge status: Home / Self Care | Payer: BLUE CROSS/BLUE SHIELD | Source: Ambulatory Visit | Attending: Family Medicine | Admitting: Family Medicine

## 2017-03-23 DIAGNOSIS — M7989 Other specified soft tissue disorders: Secondary | ICD-10-CM | POA: Diagnosis not present

## 2017-03-24 ENCOUNTER — Ambulatory Visit (HOSPITAL_COMMUNITY): Payer: BLUE CROSS/BLUE SHIELD | Admitting: Physical Therapy

## 2017-03-24 DIAGNOSIS — M6281 Muscle weakness (generalized): Secondary | ICD-10-CM

## 2017-03-24 DIAGNOSIS — M545 Low back pain, unspecified: Secondary | ICD-10-CM

## 2017-03-24 DIAGNOSIS — R293 Abnormal posture: Secondary | ICD-10-CM

## 2017-03-24 DIAGNOSIS — M542 Cervicalgia: Secondary | ICD-10-CM
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
## 2017-03-26 ENCOUNTER — Telehealth (HOSPITAL_COMMUNITY): Payer: Self-pay

## 2017-03-26 ENCOUNTER — Ambulatory Visit (HOSPITAL_COMMUNITY): Payer: BLUE CROSS/BLUE SHIELD | Admitting: Physical Therapy

## 2017-03-26 NOTE — Telephone Encounter (Signed)
03/26/17  pt said she had a headache and didn't feel like doing therapy today

## 2017-03-31 ENCOUNTER — Ambulatory Visit (HOSPITAL_COMMUNITY): Payer: BLUE CROSS/BLUE SHIELD | Admitting: Physical Therapy

## 2017-03-31 DIAGNOSIS — M545 Low back pain, unspecified: Secondary | ICD-10-CM

## 2017-03-31 DIAGNOSIS — M6281 Muscle weakness (generalized): Secondary | ICD-10-CM

## 2017-03-31 DIAGNOSIS — M542 Cervicalgia: Secondary | ICD-10-CM

## 2017-03-31 DIAGNOSIS — R293 Abnormal posture: Secondary | ICD-10-CM

## 2017-03-31 NOTE — Patient Instructions (Signed)
   HIP HIKES  While standing up on a step, lower one leg downward towards the floor by tilting your pelvis to the side.   Then return the pelvis/leg back to a leveled position.  Repeat 10-15 times each side, 1-2 times per day.    Stand to [employer] in standing near the front of the chair. Cross your arms over your chest or out of front of you (so they don't assist you). Very SLOWLY (should take you 4 seconds), start sitting. -Keep your bottom back -Don't let your knees travel past your toes -Keep knee's pointing forward (don't let touch) -Have your feet firmly planted in ground  Repeat 10-15 times, 1-2 time per day.    WALL SITS  Lean against wall with feet at least 2 feet from wall and shoulder width apart.  Slide hips toward floor to sitting position (knees should not move out over toes). Hold 5-10 seconds, then raise up.  Repeat 10-15 times, 1-2 times per day.
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
## 2017-04-02 ENCOUNTER — Ambulatory Visit (HOSPITAL_COMMUNITY): Payer: BLUE CROSS/BLUE SHIELD | Admitting: Physical Therapy

## 2017-04-02 ENCOUNTER — Telehealth (HOSPITAL_COMMUNITY): Payer: Self-pay

## 2017-04-02 NOTE — Telephone Encounter (Signed)
04/02/17  having work done on her car and isn't sure if she will be able to get here in time

## 2017-04-07 ENCOUNTER — Ambulatory Visit (HOSPITAL_COMMUNITY): Payer: BLUE CROSS/BLUE SHIELD | Attending: Orthopaedic Surgery

## 2017-04-07 DIAGNOSIS — R293 Abnormal posture: Secondary | ICD-10-CM | POA: Diagnosis present

## 2017-04-07 DIAGNOSIS — M545 Low back pain, unspecified: Secondary | ICD-10-CM

## 2017-04-07 DIAGNOSIS — M6281 Muscle weakness (generalized): Secondary | ICD-10-CM

## 2017-04-07 DIAGNOSIS — R2689 Other abnormalities of gait and mobility: Secondary | ICD-10-CM | POA: Diagnosis present

## 2017-04-07 DIAGNOSIS — M542 Cervicalgia: Secondary | ICD-10-CM | POA: Diagnosis present

## 2017-04-07 DIAGNOSIS — M62830 Muscle spasm of back: Secondary | ICD-10-CM | POA: Insufficient documentation

## 2017-04-07 NOTE — Therapy (Signed)
[place], [place], [address] Phone: [phone]   Fax:  [phone]  Physical Therapy Treatment  Patient Details  Name: Vanessa [name] MRN: [id number] Date of Birth: 08/23/1965 Referring Provider: Sanjuana Kava   Encounter Date: 04/07/2017      PT End of Session - 04/07/17 1617    Visit Number 8   Number of Visits 13   Date for PT Re-Evaluation 04/09/17   Authorization Type [employer] of [place] Time Period 5/40/08 to 01/09/60; recert done 04/08/08   PT Start Time 3267   PT Stop Time 1644   PT Time Calculation (min) 40 min   Activity Tolerance Patient tolerated treatment well;Patient limited by fatigue  PT apt following work, fatigued through session.   Behavior During Therapy [facility] for tasks assessed/performed      Past Medical History:  Diagnosis Date  . Arthritis   . Hypertension   . Iron deficiency anemia   . Migraine     Past Surgical History:  Procedure Laterality Date  . ABDOMINAL HYSTERECTOMY    . ECTOPIC PREGNANCY SURGERY      There were no vitals filed for this visit.      Subjective Assessment - 04/07/17 1609    Subjective Pt stated she has minimal pain difficulty today.  Reports she had forgotten new HEP and requested new printout.  Reports she is tired wtih PT sessions as she worked 8hrs prior apt today.     Patient Stated Goals get rid of pain    Currently in Pain? No/denies                         [facility] Adult PT Treatment/Exercise - 04/07/17 0001      Lumbar Exercises: Stretches   Quad Stretch 2 reps;30 seconds   Quad Stretch Limitations prone with rope   ITB Stretch 2 reps;30 seconds   ITB Stretch Limitations standing     Lumbar Exercises: Standing   Heel Raises 15 reps   Heel Raises Limitations heel and toe raises    Functional Squats 10 reps   Functional Squats Limitations cueing for form   Other Standing Lumbar Exercises forward and lateral step  ups 6 inch box 1x15 B no UEs; hip hikes 1x10 B; 3 way hip holds 1x10 B 3 second holds                    PT Short Term Goals - 03/24/17 1624      PT SHORT TERM GOAL #1   Title Patient to demonstrate full cervical and lumbar ROM as being pain free in order to improve QOL and job tolerance    Baseline 8/21- lumbar rom is generally WFL, cervical spine remains mildly stiff    Time 3   Period Weeks   Status Partially Met     PT SHORT TERM GOAL #2   Title patient to demonstrate correct posture 75% of the time without cues in order to reduce pain and demonstrate improved core strength    Baseline 8/21- improving    Time 3   Period Weeks   Status Achieved     PT SHORT TERM GOAL #3   Title Patient to demonstrate resolution of piriformis flexiblity impairments in order to reduce and prevent futher potential sciatic nerve irritation potentially contributing to symptoms    Baseline 8/21- improving however remains functionally tight and sore  Time 3   Period Weeks   Status On-going     PT SHORT TERM GOAL #4   Title patient to be compliant with correct performance of HEP, to be updated PRN    Baseline 8/21- compliant    Time 1   Period Weeks   Status Achieved           PT Long Term Goals - 03/24/17 1627      PT LONG TERM GOAL #1   Title Patient to demonstrate functional strength as being at least 4/5 in order to reduce pain and improve work tolerance    Baseline 8/21- remains weak    Time 6   Period Weeks   Status On-going     PT LONG TERM GOAL #2   Title Pt will report no more than 1/10 pain with daily activity in her back and cervical region to improve her QOL and work tolerance    Baseline 8/21- mostly stays at 1/10 but can spike depending on movement    Time 6   Period Weeks   Status Partially Met     PT LONG TERM GOAL #3   Title Paitent to demonstrate correct functional lifting mechanics in order to demonstrate improved functional muscle strength and to  prevent recurrence of pain at work    Time 6   Period Weeks   Status Partially Met     PT LONG TERM GOAL #4   Title Patient to be compliant with correct performance of advanced HEP in order to maintain functional gains after being discharged from PT   Baseline 8/21- ongoing    Time 6   Period Weeks   Status On-going               Plan - 04/07/17 1632    Clinical Impression Statement Pt reports her neck and back are feeling a lot better though reports she has not been complaint with HEP due to working 8hr daily and fatigue.  Reviewed new exericses given last session and educated benefits of increasing compliance with HEP for functional strengthening and pain relief.  Pt able to complete with min cueing for proper form with exercise.  Pt c/o cramping during CKC, added LE stretches to address tightness with reports of cramps resolved but does continue to c/o soreness in quads following therex.  Pt encouraged to increase compliance with HEP and walking program to assist with soreness and functional strengthening improvements.  No reoprts of pain, was limited by fatigue through session following work.     Clinical Impairments Affecting Rehab Potential (+) young age, non-complicated PMH (-) exacerbation of symptoms after ending PT last year    PT Frequency 2x / week   PT Duration 3 weeks   PT Treatment/Interventions ADLs/Self Care Home Management;Cryotherapy;Moist Heat;DME Instruction;Gait training;Stair training;Functional mobility training;Therapeutic activities;Therapeutic exercise;Balance training;Neuromuscular re-education;Patient/family education;Manual techniques;Dry needling   PT Next Visit Plan Reassess next session.  Continue LE and core strength progression. Review compliance/form with HEP.     PT Home Exercise Plan Eval: SKTC, lumbar rotations, piriformis stretch, chin tucks, upper trap stretch 8/28: wall sits, hip hikes, eccentric stand to sit       Patient will benefit from  skilled therapeutic intervention in order to improve the following deficits and impairments:     Visit Diagnosis: Cervicalgia  Muscle weakness (generalized)  Abnormal posture  Bilateral low back pain without sciatica, unspecified chronicity     Problem List There are no active problems to display for this  patient.  [address], LPTA; CBIS [phone]  Aldona Lento 04/07/2017, [place]  [address] [place], [place], [address] Phone: [phone]   Fax:  [phone]  Name: Vanessa [name] MRN: [id number] Date of Birth: 12/22/1965

## 2017-04-09 ENCOUNTER — Encounter (HOSPITAL_COMMUNITY): Payer: Self-pay

## 2017-04-09 ENCOUNTER — Encounter: Payer: Self-pay | Admitting: Orthopaedic Surgery

## 2017-04-09 ENCOUNTER — Ambulatory Visit (HOSPITAL_COMMUNITY): Payer: BLUE CROSS/BLUE SHIELD

## 2017-04-09 ENCOUNTER — Ambulatory Visit (INDEPENDENT_AMBULATORY_CARE_PROVIDER_SITE_OTHER): Payer: BLUE CROSS/BLUE SHIELD | Admitting: Orthopaedic Surgery

## 2017-04-09 VITALS — BP 135/96 | HR 56 | Temp 97.1°F | Ht 67.25 in | Wt 140.0 lb

## 2017-04-09 DIAGNOSIS — G8929 Other chronic pain: Secondary | ICD-10-CM

## 2017-04-09 DIAGNOSIS — M545 Low back pain, unspecified: Secondary | ICD-10-CM

## 2017-04-09 DIAGNOSIS — M542 Cervicalgia: Secondary | ICD-10-CM

## 2017-04-09 DIAGNOSIS — R293 Abnormal posture: Secondary | ICD-10-CM

## 2017-04-09 DIAGNOSIS — M6281 Muscle weakness (generalized): Secondary | ICD-10-CM

## 2017-04-09 DIAGNOSIS — M62830 Muscle spasm of back: Secondary | ICD-10-CM

## 2017-04-09 DIAGNOSIS — R2689 Other abnormalities of gait and mobility: Secondary | ICD-10-CM

## 2017-04-09 NOTE — Patient Instructions (Signed)
  Right Levator Scapulae Stretch  Begin by retracting your head back into a chin tuck position with shoulder blades back. Next, place right hand under your left upper thigh/leg with your palm facing up and lean your nose toward your left armpit.  You should be looking towards your opposite pocket of the affected side.  Gentle is important, do not go into pain.     Add in with your other stretches, 3-5 stretches holding for 30-60 seconds

## 2017-04-09 NOTE — Therapy (Signed)
[place], [place], [address] Phone: [phone]   Fax:  [phone]  Physical Therapy Treatment/Discharge Summary  Patient Details  Name: Vanessa [name] MRN: [id number] Date of Birth: 08-18-1965 Referring Provider: Sanjuana Kava   Encounter Date: 04/09/2017      PT End of Session - 04/09/17 1604    Visit Number 9   Number of Visits 13   Date for PT Re-Evaluation 04/09/17   Authorization Type [employer] of [place] Time Period 6/83/41 to 04/09/21; recert done 09/12/77   PT Start Time 1603   PT Stop Time 1630   PT Time Calculation (min) 27 min   Activity Tolerance Patient tolerated treatment well;Patient limited by fatigue  PT apt following work, fatigued through session.   Behavior During Therapy [facility] for tasks assessed/performed      Past Medical History:  Diagnosis Date  . Arthritis   . Hypertension   . Iron deficiency anemia   . Migraine     Past Surgical History:  Procedure Laterality Date  . ABDOMINAL HYSTERECTOMY    . ECTOPIC PREGNANCY SURGERY      There were no vitals filed for this visit.      Subjective Assessment - 04/09/17 1604    Subjective Pt states she was sore following last treatment session and is still a little sore today. She bought a step to do her step ups at home. She saw Dr. Luna Glasgow this morning and he was pleased with her progression and told her to f/u in 1 month.   Patient Stated Goals get rid of pain    Currently in Pain? Yes   Pain Score 2   1.5   Pain Location Neck   Pain Orientation Right   Pain Descriptors / Indicators Nagging   Pain Type Chronic pain   Pain Onset More than a month ago   Pain Frequency Constant   Aggravating Factors  doing any one thing for too long   Pain Relieving Factors ice   Effect of Pain on Daily Activities just annoying            [facility] PT Assessment - 04/09/17 0001      AROM   Cervical Flexion WFL    Cervical Extension  WFL   Cervical - Right Side Bend [facility]   Cervical - Left Side Bend WFL, min pain on R   Cervical - Right Rotation WFL, min pain on R   Cervical - Left Rotation [facility]     Strength   Right Hip Flexion 5/5  was 4+   Right Hip Extension 4/5  was 3-   Right Hip ABduction 4+/5  was 4-   Left Hip Flexion 5/5  was 4+   Left Hip Extension 4/5  was 3+   Left Hip ABduction 4/5  was 3   Right Knee Flexion 5/5  was 4+   Right Knee Extension 5/5  was 4+   Left Knee Flexion 5/5  was 4+   Left Knee Extension 5/5     Flexibility   Piriformis WFL BLE               PT Education - 04/09/17 1633    Education provided Yes   Education Details added levator scap stretch to HEP, discharge plans, can return with referral if she notices a decline in function   Person(s) Educated Patient   Methods Explanation;Demonstration;Handout   Comprehension Returned demonstration;Verbalized understanding  PT Short Term Goals - 04/09/17 1612      PT SHORT TERM GOAL #1   Title Patient to demonstrate full cervical and lumbar ROM as being pain free in order to improve QOL and job tolerance    Baseline 9/6: Cervical AROM WFL, mild pain with LF and rotation   Time 3   Period Weeks   Status Partially Met     PT SHORT TERM GOAL #2   Title patient to demonstrate correct posture 75% of the time without cues in order to reduce pain and demonstrate improved core strength    Baseline 8/21- improving    Time 3   Period Weeks   Status Achieved     PT SHORT TERM GOAL #3   Title Patient to demonstrate resolution of piriformis flexiblity impairments in order to reduce and prevent futher potential sciatic nerve irritation potentially contributing to symptoms    Baseline 9/6: WFL and non-painful BLE   Time 3   Period Weeks   Status Achieved     PT SHORT TERM GOAL #4   Title patient to be compliant with correct performance of HEP, to be updated PRN    Baseline 8/21- compliant    Time 1   Period  Weeks   Status Achieved           PT Long Term Goals - 04/09/17 1611      PT LONG TERM GOAL #1   Title Patient to demonstrate functional strength as being at least 4/5 in order to reduce pain and improve work tolerance    Baseline 9/6: at least 4/5 in all LE muscle groups tested   Time 6   Period Weeks   Status Achieved     PT LONG TERM GOAL #2   Title Pt will report no more than 1/10 pain with daily activity in her back and cervical region to improve her QOL and work tolerance    Baseline 9/6: mostly stays at 1/10 but can spike depending on movement    Time 6   Period Weeks   Status Partially Met     PT [place] #3   Title Paitent to demonstrate correct functional lifting mechanics in order to demonstrate improved functional muscle strength and to prevent recurrence of pain at work    Baseline 9/6: demonstrated proper lifting technique but still had some LBP during   Time 6   Period Weeks   Status Partially Met     PT LONG TERM GOAL #4   Title Patient to be compliant with correct performance of advanced HEP in order to maintain functional gains after being discharged from PT   Baseline 9/6: no issues or questions with updated HEP   Time 6   Period Weeks   Status Achieved               Plan - 04/09/17 1633    Clinical Impression Statement Reassessment performed this date. Pt has either met or partially met all of her STG and LTG. Pt verbalizes that she can tell a big difference in her pain since starting therapy. Pt still had some pain with cervical AROM, min LBP with lifting box floor to chest, and some soreness from her last treatment session. Overall, pt states that she can do anything she needs or wants to at home and at work. She reported that the main thing she was having issues with at home was lifting her laundry basket; PT educated pt to put  the basket on a chair or stool to elevate it so that she doesn't have to bend down as far to pick it up and she  verbalized understanding. Pt is pleased with where her function is at and wishes to be discharged. PT provided pt with levator scap stretch as this is the area she c/o pain during cervical AROM. PT educated pt that she can return with referral if she notices a decline in function and she verbalized understanding.    Clinical Impairments Affecting Rehab Potential (+) young age, non-complicated PMH (-) exacerbation of symptoms after ending PT last year    PT Frequency 2x / week   PT Duration 3 weeks   PT Treatment/Interventions ADLs/Self Care Home Management;Cryotherapy;Moist Heat;DME Instruction;Gait training;Stair training;Functional mobility training;Therapeutic activities;Therapeutic exercise;Balance training;Neuromuscular re-education;Patient/family education;Manual techniques;Dry needling   PT Next Visit Plan discharge   PT Home Exercise Plan Eval: SKTC, lumbar rotations, piriformis stretch, chin tucks, upper trap stretch 8/28: wall sits, hip hikes, eccentric stand to sit; 9/6: levator scap stretch   Consulted and Agree with Plan of Care Patient      Patient will benefit from skilled therapeutic intervention in order to improve the following deficits and impairments:     Visit Diagnosis: Cervicalgia  Muscle weakness (generalized)  Abnormal posture  Bilateral low back pain without sciatica, unspecified chronicity  Other abnormalities of gait and mobility  Muscle spasm of back     Problem List There are no active problems to display for this patient.    PHYSICAL THERAPY DISCHARGE SUMMARY  Visits from Start of Care: 9  Current functional level related to goals / functional outcomes: See clinical impression above   Remaining deficits: See clinical impression above   Education / Equipment: Continue HEP, added levator scap stretch Plan: Patient agrees to discharge.  Patient goals were partially met. Patient is being discharged due to being pleased with the current  functional level.  ?????        Geraldine Solar PT, [place] [address] [place], [place], [address] Phone: [phone]   Fax:  [phone]  Name: JALAYNA JOSTEN MRN: [id number] Date of Birth: 10/20/1965

## 2017-04-09 NOTE — Progress Notes (Signed)
Patient RU:EAVWUJ Vanessa [name], female DOB:01/17/1966, 51 y.o. WJX:[id number]  Chief Complaint  Patient presents with  . Follow-up    Neck    HPI  Vanessa [name] is a 51 y.o. female who has neck and lower back pain. She has been going to PT.  Her neck is much improved. Her lower back is still tender at times.  PT is helping.  She is doing better overall.  I want her to continue PT.  She has no new trauma, no paresthesias. HPI  Body mass index is 21.76 kg/m.  ROS  Review of Systems  HENT: Negative for congestion.   Respiratory: Negative for cough and shortness of breath.   Cardiovascular: Negative for chest pain and leg swelling.  Endocrine: Negative for cold intolerance.  Musculoskeletal: Positive for arthralgias, back pain, myalgias, neck pain and neck stiffness.  Allergic/Immunologic: Negative for environmental allergies.  Neurological: Positive for headaches.    Past Medical History:  Diagnosis Date  . Arthritis   . Hypertension   . Iron deficiency anemia   . Migraine     Past Surgical History:  Procedure Laterality Date  . ABDOMINAL HYSTERECTOMY    . ECTOPIC PREGNANCY SURGERY      Family History  Problem Relation Age of Onset  . High blood pressure Mother   . Coronary artery disease Mother   . Hypertension Mother   . Heart attack Mother   . Hypertension Sister     Social History Social History  Substance Use Topics  . Smoking status: Never Smoker  . Smokeless tobacco: Never Used  . Alcohol use No    No Known Allergies  Current Outpatient Prescriptions  Medication Sig Dispense Refill  . cyclobenzaprine (FLEXERIL) 5 MG tablet Take 1 tablet (5 mg total) by mouth 3 (three) times daily as needed (muscle soreness). (Patient taking differently: Take 10 mg by mouth 3 (three) times daily as needed (muscle soreness). ) 30 tablet 0  . diclofenac (VOLTAREN) 75 MG EC tablet Take 1 tablet (75 mg total) by mouth 2 (two) times daily with a meal. 60 tablet 2  . lisinopril  (PRINIVIL,ZESTRIL) 5 MG tablet Take 5 mg by mouth at bedtime.    . nitroGLYCERIN (NITROSTAT) 0.4 MG SL tablet Place 1 tablet (0.4 mg total) under the tongue every 5 (five) minutes as needed for chest pain. 25 tablet 3   No current facility-administered medications for this visit.      Physical Exam  Blood pressure (!) 135/96, pulse (!) 56, temperature (!) 97.1 F (36.2 C), height 5' 7.25" (1.708 m), weight 140 lb (63.5 kg).  Constitutional: overall normal hygiene, normal nutrition, well developed, normal grooming, normal body habitus. Assistive device:none  Musculoskeletal: gait and station Limp none, muscle tone and strength are normal, no tremors or atrophy is present.  .  Neurological: coordination overall normal.  Deep tendon reflex/nerve stretch intact.  Sensation normal.  Cranial nerves II-XII intact.   Skin:   Normal overall no scars, lesions, ulcers or rashes. No psoriasis.  Psychiatric: Alert and oriented x 3.  Recent memory intact, remote memory unclear.  Normal mood and affect. Well groomed.  Good eye contact.  Cardiovascular: overall no swelling, no varicosities, no edema bilaterally, normal temperatures of the legs and arms, no clubbing, cyanosis and good capillary refill.  Lymphatic: palpation is normal.  Her neck has full ROM and no pain, no spasm.  Spine/Pelvis examination:  Inspection:  Overall, sacoiliac joint benign and hips nontender; without crepitus or defects.  Thoracic spine inspection: Alignment normal without kyphosis present   Lumbar spine inspection:  Alignment  with normal lumbar lordosis, without scoliosis apparent.   Thoracic spine palpation:  without tenderness of spinal processes   Lumbar spine palpation: with tenderness of lumbar area; without tightness of lumbar muscles    Range of Motion:   Lumbar flexion, forward flexion is 45 without pain or tenderness    Lumbar extension is 10 without pain or tenderness   Left lateral bend is Normal   without pain or tenderness   Right lateral bend is Normal without pain or tenderness   Straight leg raising is Normal   Strength & tone: Normal   Stability overall normal stability     The patient has been educated about the nature of the problem(s) and counseled on treatment options.  The patient appeared to understand what I have discussed and is in agreement with it.  Encounter Diagnoses  Name Primary?  . Cervicalgia Yes  . Chronic bilateral low back pain without sciatica     PLAN Call if any problems.  Precautions discussed.  Continue current medications.   Return to clinic 1 month   Continue PT.  Electronically Signed Darreld McleanWayne Maurianna Benard, MD 9/6/20188:18 AM

## 2017-05-04 ENCOUNTER — Emergency Department (HOSPITAL_COMMUNITY)
Admission: EM | Admit: 2017-05-04 | Discharge: 2017-05-04 | Disposition: A | Discharge status: Home / Self Care | Payer: BLUE CROSS/BLUE SHIELD | Attending: Emergency Medicine | Admitting: Emergency Medicine

## 2017-05-04 ENCOUNTER — Emergency Department (HOSPITAL_COMMUNITY): Payer: BLUE CROSS/BLUE SHIELD

## 2017-05-04 DIAGNOSIS — Z79899 Other long term (current) drug therapy: Secondary | ICD-10-CM | POA: Insufficient documentation

## 2017-05-04 DIAGNOSIS — R0789 Other chest pain: Secondary | ICD-10-CM | POA: Diagnosis not present

## 2017-05-04 DIAGNOSIS — I10 Essential (primary) hypertension: Secondary | ICD-10-CM | POA: Diagnosis not present

## 2017-05-04 LAB — CBC
HEMATOCRIT: 41.7 % (ref 36.0–46.0)
HEMOGLOBIN: 14 g/dL (ref 12.0–15.0)
MCH: 26.9 pg (ref 26.0–34.0)
MCHC: 33.6 g/dL (ref 30.0–36.0)
MCV: 80.2 fL (ref 78.0–100.0)
Platelets: 213 10*3/uL (ref 150–400)
RBC: 5.2 MIL/uL — AB (ref 3.87–5.11)
RDW: 13.4 % (ref 11.5–15.5)
WBC: 7.4 10*3/uL (ref 4.0–10.5)

## 2017-05-04 LAB — I-STAT TROPONIN, ED: Troponin i, poc: 0 ng/mL (ref 0.00–0.08)

## 2017-05-04 LAB — BASIC METABOLIC PANEL
ANION GAP: 7 (ref 5–15)
BUN: 13 mg/dL (ref 6–20)
CHLORIDE: 110 mmol/L (ref 101–111)
CO2: 29 mmol/L (ref 22–32)
Calcium: 9.8 mg/dL (ref 8.9–10.3)
Creatinine, Ser: 0.87 mg/dL (ref 0.44–1.00)
GFR calc non Af Amer: >60 mL/min (ref >60)
Glucose, Bld: 102 mg/dL — ABNORMAL HIGH (ref 65–99)
POTASSIUM: 3.3 mmol/L — AB (ref 3.5–5.1)
SODIUM: 146 mmol/L — AB (ref 135–145)

## 2017-05-04 LAB — TROPONIN I

## 2017-05-04 NOTE — Discharge Instructions (Signed)
Use ice and heat for comfort. Take ibuprofen 600 mg + Acetaminophen 1000 mg 4 times a day for pain. Recheck if you get a cough, fever, struggle to breathe or feel worse.

## 2017-05-04 NOTE — ED Notes (Signed)
Dr Lynelle Doctor made aware of pt's bradycardia (rate in 40's). No new orders given as this is normal for pt.
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
## 2017-05-04 NOTE — ED Triage Notes (Signed)
Pt c/o left sided chest pain that started an hour pta; pt states she was lying down when the pain started and states the pain moves down her right arm

## 2017-05-07 ENCOUNTER — Ambulatory Visit: Payer: BLUE CROSS/BLUE SHIELD | Admitting: Orthopaedic Surgery

## 2017-05-12 ENCOUNTER — Encounter: Payer: Self-pay | Admitting: Orthopaedic Surgery

## 2017-05-12 ENCOUNTER — Ambulatory Visit (INDEPENDENT_AMBULATORY_CARE_PROVIDER_SITE_OTHER): Payer: BLUE CROSS/BLUE SHIELD | Admitting: Orthopaedic Surgery

## 2017-05-12 VITALS — BP 126/81 | HR 55 | Temp 98.7°F | Ht 67.0 in | Wt 138.0 lb

## 2017-05-12 DIAGNOSIS — M542 Cervicalgia: Secondary | ICD-10-CM | POA: Diagnosis not present

## 2017-05-12 NOTE — Progress Notes (Signed)
Patient Vanessa [name] [name], female DOB:13-Jun-1966, 51 y.o. VWU:[id number]  Chief Complaint  Patient presents with  . Follow-up    neck pain    HPI  Vanessa [name] is a 51 y.o. female who has neck pain.  She has been taking her medicine and doing the exercises.  She is much improved and has little pain today.  She has no paresthesias.   HPI  Body mass index is 21.61 kg/m.  ROS  Review of Systems  HENT: Negative for congestion.   Respiratory: Negative for cough and shortness of breath.   Cardiovascular: Negative for chest pain and leg swelling.  Endocrine: Negative for cold intolerance.  Musculoskeletal: Positive for arthralgias, back pain, myalgias, neck pain and neck stiffness.  Allergic/Immunologic: Negative for environmental allergies.  Neurological: Positive for headaches.    Past Medical History:  Diagnosis Date  . Arthritis   . Hypertension   . Iron deficiency anemia   . Migraine     Past Surgical History:  Procedure Laterality Date  . ABDOMINAL HYSTERECTOMY    . ECTOPIC PREGNANCY SURGERY      Family History  Problem Relation Age of Onset  . High blood pressure Mother   . Coronary artery disease Mother   . Hypertension Mother   . Heart attack Mother   . Hypertension Sister     Social History Social History  Substance Use Topics  . Smoking status: Never Smoker  . Smokeless tobacco: Never Used  . Alcohol use No    No Known Allergies  Current Outpatient Prescriptions  Medication Sig Dispense Refill  . cyclobenzaprine (FLEXERIL) 5 MG tablet Take 1 tablet (5 mg total) by mouth 3 (three) times daily as needed (muscle soreness). (Patient taking differently: Take 10 mg by mouth 3 (three) times daily as needed (muscle soreness). ) 30 tablet 0  . diclofenac (VOLTAREN) 75 MG EC tablet Take 1 tablet (75 mg total) by mouth 2 (two) times daily with a meal. 60 tablet 2  . lisinopril (PRINIVIL,ZESTRIL) 5 MG tablet Take 5 mg by mouth at bedtime.    . nitroGLYCERIN  (NITROSTAT) 0.4 MG SL tablet Place 1 tablet (0.4 mg total) under the tongue every 5 (five) minutes as needed for chest pain. 25 tablet 3   No current facility-administered medications for this visit.      Physical Exam  Blood pressure 126/81, pulse (!) 55, temperature 98.7 F (37.1 C), height  (1.702 m), weight 138 lb (62.6 kg).  Constitutional: overall normal hygiene, normal nutrition, well developed, normal grooming, normal body habitus. Assistive device:none  Musculoskeletal: gait and station Limp none, muscle tone and strength are normal, no tremors or atrophy is present.  .  Neurological: coordination overall normal.  Deep tendon reflex/nerve stretch intact.  Sensation normal.  Cranial nerves II-XII intact.   Skin:   Normal overall no scars, lesions, ulcers or rashes. No psoriasis.  Psychiatric: Alert and oriented x 3.  Recent memory intact, remote memory unclear.  Normal mood and affect. Well groomed.  Good eye contact.  Cardiovascular: overall no swelling, no varicosities, no edema bilaterally, normal temperatures of the legs and arms, no clubbing, cyanosis and good capillary refill.  Lymphatic: palpation is normal.  All other systems reviewed and are negative   Neck has full ROM and no pain.  She has normal grips, NV intact.  The patient has been educated about the nature of the problem(s) and counseled on treatment options.  The patient appeared to understand what I have  discussed and is in agreement with it.  Encounter Diagnosis  Name Primary?  . Cervicalgia Yes    PLAN Call if any problems.  Precautions discussed.  Continue current medications.   Return to clinic prn   Electronically Signed Darreld Mclean, MD 10/9/20189:33 AM

## 2017-09-24 ENCOUNTER — Encounter (INDEPENDENT_AMBULATORY_CARE_PROVIDER_SITE_OTHER): Payer: Self-pay | Admitting: *Deleted
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
## 2017-12-17 ENCOUNTER — Encounter: Payer: Self-pay | Admitting: Orthopaedic Surgery

## 2017-12-17 ENCOUNTER — Ambulatory Visit: Payer: BLUE CROSS/BLUE SHIELD | Admitting: Orthopaedic Surgery

## 2017-12-17 VITALS — BP 124/89 | HR 81 | Ht 67.0 in | Wt 137.0 lb

## 2017-12-17 DIAGNOSIS — G8929 Other chronic pain: Secondary | ICD-10-CM

## 2017-12-17 DIAGNOSIS — M5441 Lumbago with sciatica, right side: Secondary | ICD-10-CM

## 2017-12-17 MED ORDER — HYDROCODONE-ACETAMINOPHEN 5-325 MG PO TABS: 1.0000 | ORAL_TABLET | ORAL | 0 refills | Status: AC | PRN

## 2017-12-24 ENCOUNTER — Ambulatory Visit (HOSPITAL_COMMUNITY)
Admission: RE | Admit: 2017-12-24 | Discharge: 2017-12-24 | Disposition: A | Discharge status: Home / Self Care | Payer: BLUE CROSS/BLUE SHIELD | Source: Ambulatory Visit | Attending: Orthopaedic Surgery | Admitting: Orthopaedic Surgery

## 2017-12-24 DIAGNOSIS — G8929 Other chronic pain: Secondary | ICD-10-CM | POA: Diagnosis present

## 2017-12-24 DIAGNOSIS — M5441 Lumbago with sciatica, right side: Secondary | ICD-10-CM | POA: Insufficient documentation

## 2017-12-24 DIAGNOSIS — M48061 Spinal stenosis, lumbar region without neurogenic claudication: Secondary | ICD-10-CM | POA: Insufficient documentation

## 2017-12-24 DIAGNOSIS — M5126 Other intervertebral disc displacement, lumbar region: Secondary | ICD-10-CM | POA: Diagnosis not present

## 2017-12-29 ENCOUNTER — Ambulatory Visit: Payer: BLUE CROSS/BLUE SHIELD | Admitting: Orthopaedic Surgery

## 2017-12-29 ENCOUNTER — Encounter: Payer: Self-pay | Admitting: Orthopaedic Surgery

## 2017-12-29 VITALS — BP 129/79 | HR 65 | Temp 98.1°F | Ht 67.0 in | Wt 140.0 lb

## 2017-12-29 DIAGNOSIS — G8929 Other chronic pain: Secondary | ICD-10-CM | POA: Diagnosis not present

## 2017-12-29 DIAGNOSIS — M5441 Lumbago with sciatica, right side: Secondary | ICD-10-CM

## 2017-12-29 NOTE — Progress Notes (Signed)
Patient ZO:XWRUEA JANARIA [name], female DOB:[birth year], 52 y.o. VWU:[id number]  Chief Complaint  Patient presents with  . Back Pain    HPI  Vanessa [name] is a 52 y.o. female who has continued lower back pain.  She had a MRI which showed: IMPRESSION: 1. Small left foraminal disc protrusion at L4-5, closely approximating the exiting left L4 nerve root without frank impingement. 2. Mild disc bulging and facet hypertrophy at L2-3 through L4-5 with resultant mild left lateral recess stenosis  I explained the findings to her.  I have recommended PT or consideration of epidural.  She would like to have PT.  I will arrange this. HPI  Body mass index is 21.93 kg/m.  ROS  Review of Systems  HENT: Negative for congestion.   Respiratory: Negative for cough and shortness of breath.   Cardiovascular: Negative for chest pain and leg swelling.  Endocrine: Negative for cold intolerance.  Musculoskeletal: Positive for arthralgias, back pain, myalgias, neck pain and neck stiffness.  Allergic/Immunologic: Negative for environmental allergies.  Neurological: Positive for headaches.  All other systems reviewed and are negative.   Past Medical History:  Diagnosis Date  . Arthritis   . Hypertension   . Iron deficiency anemia   . Migraine     Past Surgical History:  Procedure Laterality Date  . ABDOMINAL HYSTERECTOMY    . ECTOPIC PREGNANCY SURGERY      Family History  Problem Relation Age of Onset  . High blood pressure Mother   . Coronary artery disease Mother   . Hypertension Mother   . Heart attack Mother   . Hypertension Sister     Social History Social History   Tobacco Use  . Smoking status: Never Smoker  . Smokeless tobacco: Never Used  Substance Use Topics  . Alcohol use: No  . Drug use: No    No Known Allergies  Current Outpatient Medications  Medication Sig Dispense Refill  . cetirizine (ZYRTEC) 10 MG tablet Take 10 mg by mouth daily.    . cyclobenzaprine (FLEXERIL)  5 MG tablet Take 1 tablet (5 mg total) by mouth 3 (three) times daily as needed (muscle soreness). (Patient not taking: Reported on 12/17/2017) 30 tablet 0  . diclofenac (VOLTAREN) 75 MG EC tablet Take 1 tablet (75 mg total) by mouth 2 (two) times daily with a meal. (Patient not taking: Reported on 12/17/2017) 60 tablet 2  . lisinopril (PRINIVIL,ZESTRIL) 5 MG tablet Take 5 mg by mouth at bedtime.    . nitroGLYCERIN (NITROSTAT) 0.4 MG SL tablet Place 1 tablet (0.4 mg total) under the tongue every 5 (five) minutes as needed for chest pain. (Patient not taking: Reported on 12/17/2017) 25 tablet 3   No current facility-administered medications for this visit.      Physical Exam  Blood pressure 129/79, pulse 65, temperature 98.1 F (36.7 C), height  (1.702 m), weight 140 lb (63.5 kg).  Constitutional: overall normal hygiene, normal nutrition, well developed, normal grooming, normal body habitus. Assistive device:none  Musculoskeletal: gait and station Limp none, muscle tone and strength are normal, no tremors or atrophy is present.  .  Neurological: coordination overall normal.  Deep tendon reflex/nerve stretch intact.  Sensation normal.  Cranial nerves II-XII intact.   Skin:   Normal overall no scars, lesions, ulcers or rashes. No psoriasis.  Psychiatric: Alert and oriented x 3.  Recent memory intact, remote memory unclear.  Normal mood and affect. Well groomed.  Good eye contact.  Cardiovascular: overall no swelling,  no varicosities, no edema bilaterally, normal temperatures of the legs and arms, no clubbing, cyanosis and good capillary refill.  Lymphatic: palpation is normal.  Spine/Pelvis examination:  Inspection:  Overall, sacoiliac joint benign and hips nontender; without crepitus or defects.   Thoracic spine inspection: Alignment normal without kyphosis present   Lumbar spine inspection:  Alignment  with normal lumbar lordosis, without scoliosis apparent.   Thoracic spine  palpation:  without tenderness of spinal processes   Lumbar spine palpation: without tenderness of lumbar area; without tightness of lumbar muscles    Range of Motion:   Lumbar flexion, forward flexion is normal without pain or tenderness    Lumbar extension is full without pain or tenderness   Left lateral bend is normal without pain or tenderness   Right lateral bend is normal without pain or tenderness   Straight leg raising is normal  Strength & tone: normal   Stability overall normal stability All other systems reviewed and are negative   The patient has been educated about the nature of the problem(s) and counseled on treatment options.  The patient appeared to understand what I have discussed and is in agreement with it.  Encounter Diagnosis  Name Primary?  . Chronic right-sided low back pain with right-sided sciatica Yes    PLAN Call if any problems.  Precautions discussed.  Continue current medications.   Return to clinic 3 weeks   Begin PT.  Electronically Signed Darreld Mclean, MD 5/28/20193:43 PM

## 2017-12-30 ENCOUNTER — Telehealth (HOSPITAL_COMMUNITY): Payer: Self-pay | Admitting: Family Medicine

## 2017-12-30 NOTE — Telephone Encounter (Signed)
12/30/17  pt called and wanted to change the appt date to something else

## 2017-12-31 ENCOUNTER — Ambulatory Visit (HOSPITAL_COMMUNITY): Payer: BLUE CROSS/BLUE SHIELD

## 2018-01-05 ENCOUNTER — Encounter (HOSPITAL_COMMUNITY): Payer: Self-pay

## 2018-01-05 ENCOUNTER — Ambulatory Visit (HOSPITAL_COMMUNITY): Payer: BLUE CROSS/BLUE SHIELD | Attending: Orthopaedic Surgery

## 2018-01-05 ENCOUNTER — Other Ambulatory Visit: Payer: Self-pay

## 2018-01-05 DIAGNOSIS — M5442 Lumbago with sciatica, left side: Secondary | ICD-10-CM | POA: Diagnosis not present

## 2018-01-05 DIAGNOSIS — M6281 Muscle weakness (generalized): Secondary | ICD-10-CM

## 2018-01-05 DIAGNOSIS — G8929 Other chronic pain: Secondary | ICD-10-CM | POA: Diagnosis present

## 2018-01-05 DIAGNOSIS — R293 Abnormal posture: Secondary | ICD-10-CM | POA: Diagnosis present
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
## 2018-01-06 ENCOUNTER — Telehealth (HOSPITAL_COMMUNITY): Payer: Self-pay | Admitting: Family Medicine

## 2018-01-06 ENCOUNTER — Ambulatory Visit (HOSPITAL_COMMUNITY): Payer: BLUE CROSS/BLUE SHIELD

## 2018-01-06 NOTE — Telephone Encounter (Signed)
01/06/18  pt left a message to cx said she had a headache, nausea since yesterday

## 2018-01-11 ENCOUNTER — Telehealth (HOSPITAL_COMMUNITY): Payer: Self-pay | Admitting: Family Medicine

## 2018-01-11 ENCOUNTER — Ambulatory Visit (HOSPITAL_COMMUNITY): Payer: BLUE CROSS/BLUE SHIELD

## 2018-01-11 NOTE — Telephone Encounter (Signed)
01/11/18  pt left a message that she couldn't come today because of migraine headache

## 2018-01-13 ENCOUNTER — Other Ambulatory Visit: Payer: Self-pay | Admitting: Radiology

## 2018-01-13 ENCOUNTER — Telehealth (HOSPITAL_COMMUNITY): Payer: Self-pay | Admitting: Family Medicine

## 2018-01-13 ENCOUNTER — Ambulatory Visit (HOSPITAL_COMMUNITY): Payer: BLUE CROSS/BLUE SHIELD

## 2018-01-13 ENCOUNTER — Telehealth: Payer: Self-pay | Admitting: Orthopaedic Surgery

## 2018-01-13 DIAGNOSIS — M5441 Lumbago with sciatica, right side: Principal | ICD-10-CM

## 2018-01-13 DIAGNOSIS — G8929 Other chronic pain: Secondary | ICD-10-CM

## 2018-01-13 NOTE — Telephone Encounter (Signed)
Please set up epidural injection for her lumbar spine.

## 2018-01-13 NOTE — Telephone Encounter (Signed)
Patient called to relay that she would like to hold on physical therapy and have the injection that was mentioned. States would rather do this than pay the copay each time at therapy. Please advise.

## 2018-01-13 NOTE — Telephone Encounter (Signed)
01/13/18  pt called to say that she wanted all appts cx she is going to get the shot and not do therapy anymore

## 2018-01-13 NOTE — Telephone Encounter (Signed)
Done and patient informed.

## 2018-01-14 ENCOUNTER — Telehealth: Payer: Self-pay | Admitting: Orthopaedic Surgery

## 2018-01-14 ENCOUNTER — Encounter: Payer: Self-pay | Admitting: Orthopaedic Surgery

## 2018-01-14 ENCOUNTER — Other Ambulatory Visit: Payer: Self-pay | Admitting: Orthopaedic Surgery

## 2018-01-14 DIAGNOSIS — G8929 Other chronic pain: Secondary | ICD-10-CM

## 2018-01-14 DIAGNOSIS — M5441 Lumbago with sciatica, right side: Principal | ICD-10-CM

## 2018-01-14 NOTE — Telephone Encounter (Signed)
OK.  Give a note for her work as she requests.

## 2018-01-14 NOTE — Telephone Encounter (Signed)
Patient called saying she is scheduled for the epidural injection next Thursday 01/21/18. She is asking if you will give her a note for work stating that she can only work 8 hours a day, Monday thru Friday.   Please advise

## 2018-01-19 ENCOUNTER — Ambulatory Visit (HOSPITAL_COMMUNITY): Payer: BLUE CROSS/BLUE SHIELD

## 2018-01-19 ENCOUNTER — Ambulatory Visit: Payer: BLUE CROSS/BLUE SHIELD | Admitting: Orthopaedic Surgery

## 2018-01-21 ENCOUNTER — Ambulatory Visit
Admission: RE | Admit: 2018-01-21 | Discharge: 2018-01-21 | Disposition: A | Discharge status: Home / Self Care | Payer: BLUE CROSS/BLUE SHIELD | Source: Ambulatory Visit | Attending: Orthopaedic Surgery | Admitting: Orthopaedic Surgery

## 2018-01-21 DIAGNOSIS — M5441 Lumbago with sciatica, right side: Principal | ICD-10-CM

## 2018-01-21 DIAGNOSIS — G8929 Other chronic pain: Secondary | ICD-10-CM

## 2018-01-21 MED ORDER — IOPAMIDOL (ISOVUE-M 200) INJECTION 41%
1.0000 mL | Freq: Once | INTRAMUSCULAR | Status: AC
  Administered 2018-01-21: 1 mL via EPIDURAL

## 2018-01-21 MED ORDER — METHYLPREDNISOLONE ACETATE 40 MG/ML INJ SUSP (RADIOLOG
120.0000 mg | Freq: Once | INTRAMUSCULAR | Status: AC
  Administered 2018-01-21: 120 mg via EPIDURAL

## 2018-01-21 NOTE — Discharge Instructions (Signed)

## 2018-01-22 ENCOUNTER — Encounter (HOSPITAL_COMMUNITY): Payer: BLUE CROSS/BLUE SHIELD

## 2018-01-25 ENCOUNTER — Encounter (HOSPITAL_COMMUNITY): Payer: BLUE CROSS/BLUE SHIELD

## 2018-01-28 ENCOUNTER — Encounter (HOSPITAL_COMMUNITY): Payer: BLUE CROSS/BLUE SHIELD

## 2018-02-02 ENCOUNTER — Ambulatory Visit: Payer: BLUE CROSS/BLUE SHIELD | Admitting: Adult Health

## 2018-02-02 ENCOUNTER — Encounter: Payer: Self-pay | Admitting: Adult Health

## 2018-02-02 ENCOUNTER — Encounter (HOSPITAL_COMMUNITY): Payer: BLUE CROSS/BLUE SHIELD

## 2018-02-02 VITALS — BP 133/86 | HR 59 | Ht 67.0 in | Wt 142.0 lb

## 2018-02-02 DIAGNOSIS — N898 Other specified noninflammatory disorders of vagina: Secondary | ICD-10-CM

## 2018-02-02 DIAGNOSIS — Z113 Encounter for screening for infections with a predominantly sexual mode of transmission: Secondary | ICD-10-CM

## 2018-02-02 DIAGNOSIS — A599 Trichomoniasis, unspecified: Secondary | ICD-10-CM | POA: Diagnosis not present

## 2018-02-02 LAB — POCT WET PREP (WET MOUNT)
Clue Cells Wet Prep Whiff POC: NEGATIVE
WBC, Wet Prep HPF POC: POSITIVE

## 2018-02-02 MED ORDER — METRONIDAZOLE 500 MG PO TABS: 500.0000 mg | ORAL_TABLET | Freq: Two times a day (BID) | ORAL | 0 refills | Status: DC

## 2018-02-02 NOTE — Progress Notes (Signed)
  Subjective:     Patient ID: Vanessa [name] L Eddington, female   DOB: 15-Oct-1965, 52 y.o.   MRN: [id number]  HPI Rinaldo Cloudamela is a 52 year old black female, separated, in complaining of vagina irritation, has used new shower gel, and is a little itchy.She is sp hysterectomy. PCP is [facility].  Review of Systems +vaginal irritation, a little itchy Has used new shower gel Last sex in October Reviewed past medical,surgical, social and family history. Reviewed medications and allergies.     Objective:   Physical Exam BP 133/86 (BP Location: Left Arm, Patient Position: Sitting, Cuff Size: Normal)   Pulse (!) 59   Ht 5\' 7"  (1.702 m)   Wt 142 lb (64.4 kg)   LMP  (Exact Date)   BMI 22.24 kg/m   Skin warm and dry.Pelvic: external genitalia is normal in appearance no lesions, vagina: white frothy discharge withoutodor,urethra has no lesions or masses noted, cervix and uterus are absent, adnexa: no masses or tenderness noted. Bladder is non tender and no masses felt. Wet prep:+trich, + for fewclue cells and +WBCs. GC/CHL obtained.    PHQ 2 score 0.   Assessment:     1. Trichimoniasis   2. Vaginal irritation   3. Screening examination for STD (sexually transmitted disease)       Plan:    GC/CHL sent  Check HIV and RPR Meds ordered this encounter  Medications  . metroNIDAZOLE (FLAGYL) 500 MG tablet    Sig: Take 1 tablet (500 mg total) by mouth 2 (two) times daily.    Dispense:  14 tablet    Refill:  0    Order Specific Question:   Supervising Provider    Answer:   Duane LopeEURE, LUTHER H [[id number]]  No alcohol or sex during treatment Review handouts on trich F/U in 10 days for POC

## 2018-02-02 NOTE — Patient Instructions (Signed)

## 2018-02-03 LAB — RPR: RPR: NONREACTIVE

## 2018-02-03 LAB — HIV ANTIBODY (ROUTINE TESTING W REFLEX): HIV SCREEN 4TH GENERATION: NONREACTIVE

## 2018-02-04 LAB — GC/CHLAMYDIA PROBE AMP
Chlamydia trachomatis, NAA: NEGATIVE
NEISSERIA GONORRHOEAE BY PCR: NEGATIVE

## 2018-02-05 ENCOUNTER — Encounter (HOSPITAL_COMMUNITY): Payer: BLUE CROSS/BLUE SHIELD

## 2018-02-09 ENCOUNTER — Encounter (HOSPITAL_COMMUNITY): Payer: BLUE CROSS/BLUE SHIELD

## 2018-02-11 ENCOUNTER — Ambulatory Visit (HOSPITAL_COMMUNITY): Payer: BLUE CROSS/BLUE SHIELD

## 2018-02-15 ENCOUNTER — Ambulatory Visit: Payer: BLUE CROSS/BLUE SHIELD | Admitting: Adult Health

## 2018-02-16 ENCOUNTER — Encounter (HOSPITAL_COMMUNITY): Payer: Self-pay

## 2018-02-16 NOTE — Therapy (Signed)
[place], [place], [address] Phone: [phone]   Fax:  [phone]  Patient Details  Name: Vanessa [name] MRN: [id number] Date of Birth: [birth year] Referring Provider:  No ref. provider found  Encounter Date: 02/16/2018   PHYSICAL THERAPY DISCHARGE SUMMARY  Visits from Start of Care: 1  Current functional level related to goals / functional outcomes: Patient requested to be discharged from therapy as she opted to have an injection for pain instead. She participated in the initial evaluation on 01/05/18 and cancelled 2 appointment prior to deciding to cancel all appointments. Below is the clinical impression on 01/05/18: Clinical Impression Statement  Patient presents for recurrent/chronic LBP with pin point pain at Lt PSIS and reports of radiating pain and paresthesia down her Lt LE. She reports poor compliance with independent HEP participation after being discharged from PT previously. She presents with pain provocation with lumbar extension and the greatest pain with overpressure into the back left lumbar quadrant. Objective testing reveals limited lumbar ROM, bil LE weakness, neural tension limitations, balance impairments, postural dysfunction, poor body mechanics, and positive findings for SIJ involvement. She will benefit from skilled PT interventions to address impairments and progress functional strength and improve body mechanics to reduce pain at work and with daily activities.     Remaining deficits:  Goals not met due to not returning. PT Short Term Goals - 01/05/18 1129            PT SHORT TERM GOAL #1   Title  Patient will improve bil LE strength testing by 1/2 grade to demosntrate improved functional strength    Time  3    Period  Weeks    Status  New    Target Date  01/26/18        PT SHORT TERM GOAL #2   Title  Patient will improved core strength with double leg lower tes (increase by 5 reps)  to improve posture and body mechanics wtih functional activities    Time  3    Period  Weeks    Status  New        PT SHORT TERM GOAL #3   Title  Patient will be independent/compliant with correct performance of HEP, to be updated PRN, to improve functional strength and posture to improve msucel endurance and reduce LBP.    Time  3    Period  Weeks    Status  New       PT Long Term Goals - 01/05/18 1142            PT LONG TERM GOAL #1   Title  Patient will improve bil LE strength testing by 1 grade to demosntrate improved functional strength    Time  6    Period  Weeks    Status  New    Target Date  02/16/18        PT LONG TERM GOAL #2   Title  Pt will report no more than 3/10 pain with daily activity in her back and Lt LE to improve her QOL and work tolerance     Time  6    Period  Weeks    Status  New        PT LONG TERM GOAL #3   Title  Paitent to demonstrate correct functional lifting mechanics in order to demonstrate improved functional muscle strength and to prevent recurrence of pain at work     Time  6    Period  Weeks    Status  New        PT LONG TERM GOAL #4   Title  Patient will improve ODI score by 11 points to demonstrate significant improvement of self reported disability related to low back pain.    Time  6    Period  Weeks    Status  New          Education: Patient was educated on exam findings and importance of keeping up with exercises to maintain improvements.  Educated on attendance policy and on initial HEP.  Plan: Patient agrees to discharge.  Patient goals were not met. Patient is being discharged due to the patient's request.  ?????     Kipp Brood, PT, DPT Physical Therapist with [facility]  02/16/2018 3:18 PM    [place], [place], [address] Phone: [phone]   Fax:  [phone]

## 2018-02-22 ENCOUNTER — Ambulatory Visit (INDEPENDENT_AMBULATORY_CARE_PROVIDER_SITE_OTHER): Payer: BLUE CROSS/BLUE SHIELD | Admitting: Adult Health

## 2018-02-22 ENCOUNTER — Encounter: Payer: Self-pay | Admitting: Adult Health

## 2018-02-22 VITALS — BP 144/87 | HR 61 | Ht 67.0 in | Wt 137.5 lb

## 2018-02-22 DIAGNOSIS — Z8619 Personal history of other infectious and parasitic diseases: Secondary | ICD-10-CM | POA: Diagnosis not present

## 2018-02-22 DIAGNOSIS — Z09 Encounter for follow-up examination after completed treatment for conditions other than malignant neoplasm: Secondary | ICD-10-CM

## 2018-02-22 LAB — POCT WET PREP (WET MOUNT): TRICHOMONAS WET PREP HPF POC: ABSENT

## 2018-02-22 NOTE — Progress Notes (Signed)
  Subjective:     Patient ID: Vanessa [name], female   DOB: May 26, 1966, 52 y.o.   MRN: [id number]  HPI Vanessa [name] is a 52 year old black female, if for POC of recent trich.   Review of Systems No irritation now Reviewed past medical,surgical, social and family history. Reviewed medications and allergies.     Objective:   Physical Exam BP (!) 144/87 (BP Location: Left Arm, Patient Position: Sitting, Cuff Size: Normal)   Pulse 61   Ht 5\' 7"  (1.702 m)   Wt 137 lb 8 oz (62.4 kg)   BMI 21.54 kg/m  Skin warm and dry.Pelvic: external genitalia is normal in appearance no lesions, vagina: white discharge withoutodor,urethra has no lesions or masses noted, cervix and uterus are absent, adnexa: no masses or tenderness noted. Bladder is non tender and no masses felt. Wet prep: + for few WBCs.     Assessment:     1. History of trichomoniasis       Plan:     Use condoms if has sex F/U prn

## 2018-06-07 ENCOUNTER — Emergency Department (HOSPITAL_COMMUNITY)
Admission: EM | Admit: 2018-06-07 | Discharge: 2018-06-07 | Disposition: A | Discharge status: Home / Self Care | Payer: BLUE CROSS/BLUE SHIELD | Attending: Emergency Medicine | Admitting: Emergency Medicine

## 2018-06-07 ENCOUNTER — Encounter (HOSPITAL_COMMUNITY): Payer: Self-pay | Admitting: Emergency Medicine

## 2018-06-07 ENCOUNTER — Other Ambulatory Visit: Payer: Self-pay

## 2018-06-07 DIAGNOSIS — Y939 Activity, unspecified: Secondary | ICD-10-CM | POA: Insufficient documentation

## 2018-06-07 DIAGNOSIS — Y998 Other external cause status: Secondary | ICD-10-CM | POA: Diagnosis not present

## 2018-06-07 DIAGNOSIS — Y92410 Unspecified street and highway as the place of occurrence of the external cause: Secondary | ICD-10-CM | POA: Diagnosis not present

## 2018-06-07 DIAGNOSIS — M545 Low back pain: Secondary | ICD-10-CM | POA: Diagnosis present

## 2018-06-07 DIAGNOSIS — Z5321 Procedure and treatment not carried out due to patient leaving prior to being seen by health care provider: Secondary | ICD-10-CM | POA: Insufficient documentation
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
## 2018-06-07 NOTE — ED Notes (Signed)
Registration states patient left after Triage.

## 2019-03-07 ENCOUNTER — Other Ambulatory Visit: Payer: BLUE CROSS/BLUE SHIELD

## 2019-03-07 ENCOUNTER — Other Ambulatory Visit: Payer: Self-pay

## 2019-03-07 DIAGNOSIS — Z20822 Contact with and (suspected) exposure to covid-19: Secondary | ICD-10-CM

## 2019-03-09 LAB — NOVEL CORONAVIRUS, NAA: SARS-CoV-2, NAA: NOT DETECTED

## 2019-08-21 IMAGING — XA Imaging study
2 series · 2 of 2 positions shown · non-contrast
Comparison: none

CLINICAL DATA: Lumbosacral spondylosis without myelopathy. Low back
and left greater than right leg pain.

[Series 1: ortho standard · 1 of 1 slices shown (1 of 2)]
[im 1/1]
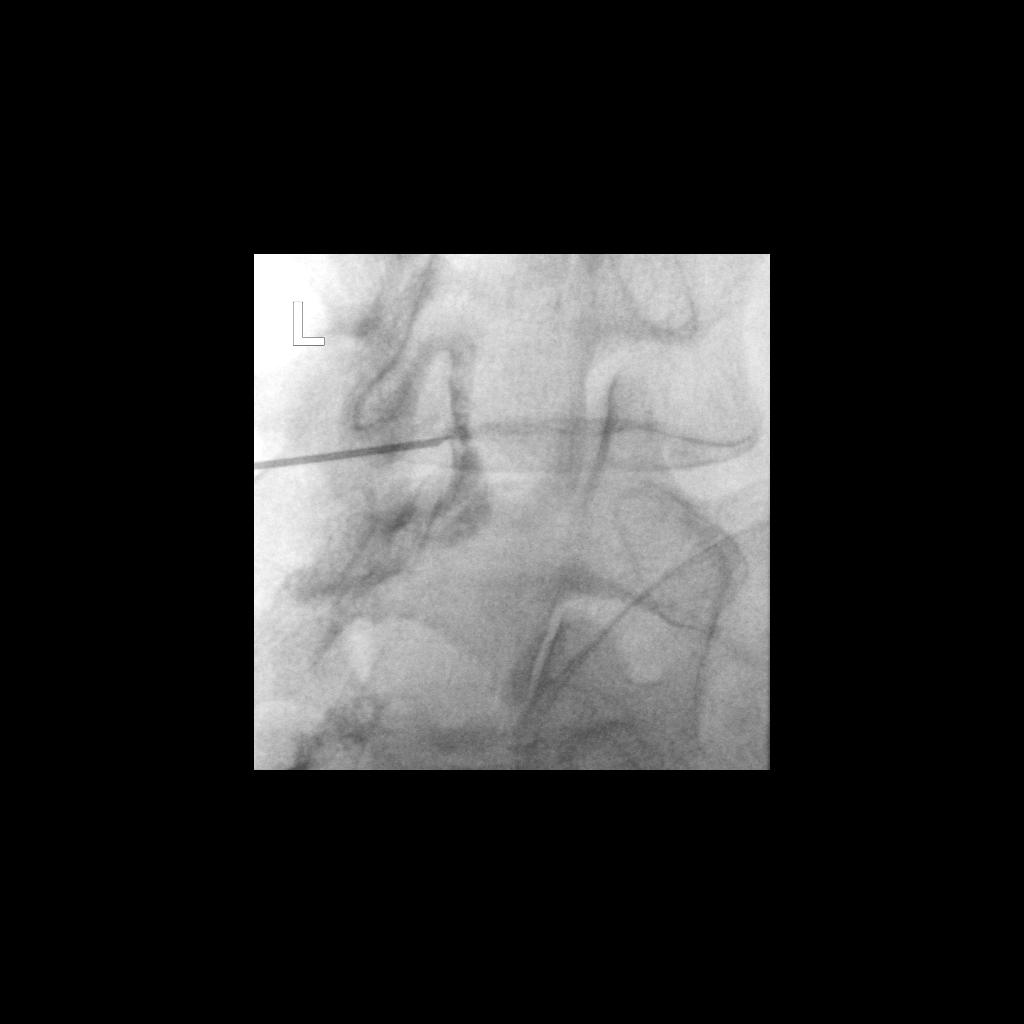

[Series 2: ortho standard · 1 of 1 slices shown (2 of 2)]
[im 1/1]
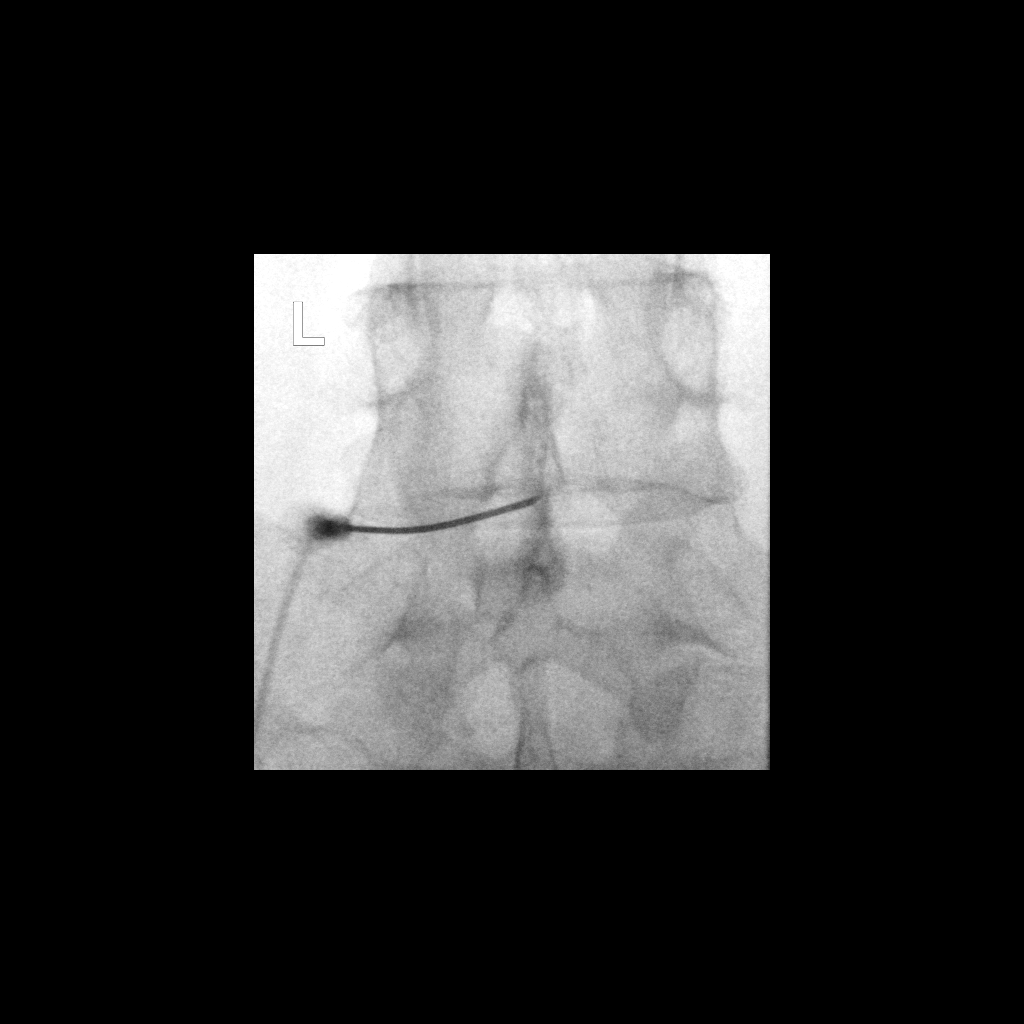

[2 of 2 positions shown; findings below may reference images not displayed]

FLUOROSCOPY TIME:  Radiation Exposure Index (as provided by the
fluoroscopic device): 7.02 microGray*m^2

Fluoroscopy Time (in minutes and seconds):  2 seconds

PROCEDURE:
The procedure, risks, benefits, and alternatives were explained to
the patient. Questions regarding the procedure were encouraged and
answered. The patient understands and consents to the procedure.

LUMBAR EPIDURAL INJECTION:

An interlaminar approach was performed on the left at L4-5. The
overlying skin was cleansed and anesthetized. A 3.5 inch 20 gauge
epidural needle was advanced using loss-of-resistance technique.

DIAGNOSTIC EPIDURAL INJECTION:

Injection of Isovue-M 200 shows a good epidural pattern with spread
above and below the level of needle placement, primarily on the
left. No vascular opacification is seen.

THERAPEUTIC EPIDURAL INJECTION:

120 mg of Depo-Medrol mixed with 3 mL of 1% lidocaine were
instilled. The procedure was well-tolerated, and the patient was
discharged thirty minutes following the injection in good condition.

COMPLICATIONS:
None
IMPRESSION: Technically successful lumbar interlaminar epidural injection on the
left at L4-5.

## 2019-09-25 ENCOUNTER — Encounter (HOSPITAL_COMMUNITY): Payer: Self-pay | Admitting: Emergency Medicine

## 2019-09-25 ENCOUNTER — Other Ambulatory Visit: Payer: Self-pay

## 2019-09-25 ENCOUNTER — Emergency Department (HOSPITAL_COMMUNITY): Payer: Self-pay

## 2019-09-25 ENCOUNTER — Emergency Department (HOSPITAL_COMMUNITY)
Admission: EM | Admit: 2019-09-25 | Discharge: 2019-09-25 | Disposition: A | Discharge status: Home / Self Care | Payer: Self-pay | Attending: Emergency Medicine | Admitting: Emergency Medicine

## 2019-09-25 DIAGNOSIS — Z79899 Other long term (current) drug therapy: Secondary | ICD-10-CM | POA: Insufficient documentation

## 2019-09-25 DIAGNOSIS — I10 Essential (primary) hypertension: Secondary | ICD-10-CM | POA: Insufficient documentation

## 2019-09-25 DIAGNOSIS — R609 Edema, unspecified: Secondary | ICD-10-CM

## 2019-09-25 DIAGNOSIS — R600 Localized edema: Secondary | ICD-10-CM | POA: Insufficient documentation

## 2019-09-25 NOTE — ED Notes (Signed)
EKG repeated as leads were reversed

## 2019-09-25 NOTE — Discharge Instructions (Signed)
Please elevate the legs tonight above the level of the heart (toes above the nose) Please contact your doctor to tell them about the leg swelling Return if you are worsening

## 2019-09-25 NOTE — ED Triage Notes (Signed)
Patient states bilateral ankle swelling x 1 week. Patient states she was taken off of lisinopril and was put on diltiazem recently. Patient denies any other symptoms.

## 2019-09-25 NOTE — ED Notes (Signed)
Pt verbalizes understanding of DC instruction  Unable to sign DC due to another in the chart

## 2019-09-25 NOTE — ED Provider Notes (Signed)
[facility] EMERGENCY DEPARTMENT Provider Note   CSN: [id number] Arrival date & time: 09/25/19  1920     History Chief Complaint  Patient presents with  . Leg Swelling    bilateral ankles    Vanessa [name] is a 55 y.o. female with history of hypertension who presents with leg swelling.  Patient states that since yesterday she has had foot and ankle swelling in the bilateral lower extremities.  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  Swelling is worse in the right ankle.  Last week she was switched from lisinopril to diltiazem because her blood pressures were still running high on the lisinopril.  Patient has no diagnosis of heart failure, kidney or liver disease.  She had an echo in 2018 which was normal.  States that her ankle feels sore.  She denies any trauma.  HPI   Past Medical History:  Diagnosis Date  . Arthritis   . Hypertension   . Iron deficiency anemia   . Migraine   . Trichimoniasis     There are no problems to display for this patient.   Past Surgical History:  Procedure Laterality Date  . ABDOMINAL HYSTERECTOMY    . ECTOPIC PREGNANCY SURGERY       OB History    Gravida  2   Para  2   Term  2   Preterm      AB      Living  2     SAB      TAB      Ectopic      Multiple      Live Births              Family History  Problem Relation Age of Onset  . High blood pressure Mother   . Coronary artery disease Mother   . Hypertension Mother   . Heart attack Mother   . Hypertension Sister     Social History   Tobacco Use  . Smoking status: Never Smoker  . Smokeless tobacco: Never Used  Substance Use Topics  . Alcohol use: No  . Drug use: No    Home Medications Prior to Admission medications   Medication Sig Start Date End Date Taking? Authorizing Provider  cetirizine (ZYRTEC) 10 MG tablet Take 10 mg by mouth as needed.     [provider]  lisinopril (PRINIVIL,ZESTRIL) 5 MG tablet Take 5 mg by mouth at  bedtime.    [provider]    Allergies    Patient has no known allergies.  Review of Systems   Review of Systems  Constitutional: Negative for fever.  Respiratory: Negative for shortness of breath.   Cardiovascular: Positive for leg swelling. Negative for chest pain.    Physical Exam Updated Vital Signs BP (!) 170/92 (BP Location: Right Arm)   Pulse 100   Temp 98.2 F (36.8 C) (Oral)   Resp 18   Ht 5\' 7"  (1.702 m)   Wt 61.7 kg   SpO2 98%   BMI 21.30 kg/m   Physical Exam Vitals and nursing note reviewed.  Constitutional:      General: She is not in acute distress.    Appearance: Normal appearance. She is well-developed. She is not ill-appearing.  HENT:     Head: Normocephalic and atraumatic.  Eyes:     General: No scleral icterus.       Right eye: No discharge.  Left eye: No discharge.     Conjunctiva/sclera: Conjunctivae normal.     Pupils: Pupils are equal, round, and reactive to light.  Cardiovascular:     Rate and Rhythm: Normal rate and regular rhythm.  Pulmonary:     Effort: Pulmonary effort is normal. No respiratory distress.     Breath sounds: Normal breath sounds.  Abdominal:     General: There is no distension.  Musculoskeletal:     Cervical back: Normal range of motion.     Comments: There is mild foot and ankle edema in the bilateral lower extremities. 2+ DP pulse. No calf swelling or tenderness  Skin:    General: Skin is warm and dry.  Neurological:     Mental Status: She is alert and oriented to person, place, and time.  Psychiatric:        Behavior: Behavior normal.     ED Results / Procedures / Treatments   Labs (all labs ordered are listed, but only abnormal results are displayed) Labs Reviewed - No data to display  EKG None  Radiology No results found.  Procedures Procedures (including critical care time)  Medications Ordered in ED Medications - No data to display  ED Course  I have reviewed the triage vital  signs and the nursing notes.  Pertinent labs & imaging results that were available during my care of the patient were reviewed by me and considered in my medical decision making (see chart for details).  54 year old female presents with bilateral foot and ankle swelling started since yesterday.  She is also been on her feet all day and had a recent medication change from ACE inhibitor to calcium channel blocker.  Blood pressure is elevated here but otherwise vital signs are reassuring.  Heart is regular rate and rhythm.  Lungs are clear to auscultation.  She has no unilateral leg swelling that would suggest DVT.  I think her symptoms are likely due to side effects from the calcium channel blocker and being on her feet all day.  Patient is encouraged to elevate her legs tonight as much as possible and contact her doctor in the morning if she continues to have persistent swelling.  MDM Rules/Calculators/A&P                      Final Clinical Impression(s) / ED Diagnoses Final diagnoses:  Peripheral edema    Rx / DC Orders ED Discharge Orders    None       Iris Pert 09/25/19 2032    Nat Christen, MD 09/30/19 [phone]

## 2019-09-25 NOTE — ED Notes (Signed)
Pt reports that she is followed by Robbie [facility]  They changed her med in January from Lisinopril to Diltiazem  Since then, she has had some swelling to her ankles but it has been worse for the last week   She reports she has no cardiologist

## 2019-12-20 ENCOUNTER — Other Ambulatory Visit: Payer: Self-pay

## 2019-12-20 ENCOUNTER — Ambulatory Visit: Payer: BLUE CROSS/BLUE SHIELD | Attending: Internal Medicine

## 2019-12-20 DIAGNOSIS — Z20822 Contact with and (suspected) exposure to covid-19: Secondary | ICD-10-CM

## 2019-12-21 LAB — SARS-COV-2, NAA 2 DAY TAT

## 2019-12-21 LAB — NOVEL CORONAVIRUS, NAA: SARS-CoV-2, NAA: NOT DETECTED

## 2021-06-01 ENCOUNTER — Encounter (HOSPITAL_COMMUNITY): Payer: Self-pay | Admitting: *Deleted

## 2021-06-01 ENCOUNTER — Emergency Department (HOSPITAL_COMMUNITY)
Admission: EM | Admit: 2021-06-01 | Discharge: 2021-06-01 | Disposition: A | Discharge status: Home / Self Care | Payer: BLUE CROSS/BLUE SHIELD | Attending: Emergency Medicine | Admitting: Emergency Medicine

## 2021-06-01 ENCOUNTER — Emergency Department (HOSPITAL_COMMUNITY): Payer: BLUE CROSS/BLUE SHIELD

## 2021-06-01 DIAGNOSIS — I10 Essential (primary) hypertension: Secondary | ICD-10-CM | POA: Insufficient documentation

## 2021-06-01 DIAGNOSIS — Z79899 Other long term (current) drug therapy: Secondary | ICD-10-CM | POA: Insufficient documentation

## 2021-06-01 DIAGNOSIS — W232XXA Caught, crushed, jammed or pinched between a moving and stationary object, initial encounter: Secondary | ICD-10-CM | POA: Insufficient documentation

## 2021-06-01 DIAGNOSIS — S60021A Contusion of right index finger without damage to nail, initial encounter: Secondary | ICD-10-CM | POA: Insufficient documentation

## 2021-06-01 DIAGNOSIS — S6010XA Contusion of unspecified finger with damage to nail, initial encounter: Secondary | ICD-10-CM

## 2021-06-01 MED ORDER — TRAMADOL HCL 50 MG PO TABS: 50.0000 mg | ORAL_TABLET | Freq: Four times a day (QID) | ORAL | 0 refills | Status: DC | PRN

## 2021-06-01 NOTE — Discharge Instructions (Signed)
Followup with your primary MD for any problems or concerns with this injury. Your xrays are negative.  I suspect this should heal completely, you may or may not end up eventually losing the nail before a new nail grows back.  Burring the hole like we did improves the likelihood that the nail will not be lost.  You may use the medication prescribed if needed for pain relief.  This will make you drowsy, do not drive within 4 hours of taking tramadol.  You can also use plain Tylenol or ibuprofen for pain relief as well.

## 2021-06-01 NOTE — ED Triage Notes (Signed)
Slammed right index finger in door at home

## 2021-06-01 NOTE — ED Provider Notes (Signed)
[facility] EMERGENCY DEPARTMENT Provider Note   CSN: [id number] Arrival date & time: 06/01/21  1602     History Chief Complaint  Patient presents with   Finger Injury    Vanessa [name] is a 55 y.o. female presenting for evaluation of a crush injury to her right index finger which occurred just prior to arrival at home.  She accidentally caught her finger in a slamming door and has had significant pain, throbbing and bruising underneath the fingernail.  She denies numbness in the finger.  She does have throbbing pain that radiates into her hand.  She has had no treatment prior to arrival.  The history is provided by the patient.      Past Medical History:  Diagnosis Date   Arthritis    Hypertension    Iron deficiency anemia    Migraine    Trichimoniasis     There are no problems to display for this patient.   Past Surgical History:  Procedure Laterality Date   ABDOMINAL HYSTERECTOMY     ECTOPIC PREGNANCY SURGERY       OB History     Gravida  2   Para  2   Term  2   Preterm      AB      Living  2      SAB      IAB      Ectopic      Multiple      Live Births              Family History  Problem Relation Age of Onset   High blood pressure Mother    Coronary artery disease Mother    Hypertension Mother    Heart attack Mother    Hypertension Sister     Social History   Tobacco Use   Smoking status: Never   Smokeless tobacco: Never  Vaping Use   Vaping Use: Never used  Substance Use Topics   Alcohol use: No   Drug use: No    Home Medications Prior to Admission medications   Medication Sig Start Date End Date Taking? Authorizing Provider  traMADol (ULTRAM) 50 MG tablet Take 1 tablet (50 mg total) by mouth every 6 (six) hours as needed. 06/01/21  Yes Belford Pascucci, Raynelle Fanning, PA-C  cetirizine (ZYRTEC) 10 MG tablet Take 10 mg by mouth as needed.     [provider]  diltiazem (DILT-XR) 240 MG 24 hr capsule Take 240 mg by mouth daily.     [provider]  lisinopril (PRINIVIL,ZESTRIL) 5 MG tablet Take 5 mg by mouth at bedtime.    [provider]    Allergies    Patient has no known allergies.  Review of Systems   Review of Systems  Constitutional:  Negative for fever.  Musculoskeletal:  Positive for arthralgias and joint swelling. Negative for myalgias.  Neurological:  Negative for weakness and numbness.  All other systems reviewed and are negative.  Physical Exam Updated Vital Signs BP (!) 179/89 (BP Location: Left Arm)   Pulse 61   Temp 98 F (36.7 C) (Oral)   Resp 16   Ht 5\' 7"  (1.702 m)   Wt 63.3 kg   SpO2 100%   BMI 21.85 kg/m   Physical Exam Constitutional:      Appearance: She is well-developed.  HENT:     Head: Atraumatic.  Cardiovascular:     Comments: Pulses equal bilaterally Musculoskeletal:  General: Tenderness present. No deformity.     Cervical back: Normal range of motion.     Comments: Right index finger with distal sensation intact.  She does have full range of motion of her finger although reduced secondary to pain.  Her nail is intact although there is a proximal subungual hematoma present.  No laceration present.  Skin:    General: Skin is warm and dry.  Neurological:     Mental Status: She is alert.     Sensory: No sensory deficit.     Motor: No weakness.     Deep Tendon Reflexes: Reflexes normal.    ED Results / Procedures / Treatments   Labs (all labs ordered are listed, but only abnormal results are displayed) Labs Reviewed - No data to display  EKG None  Radiology DG Finger Index Right  Result Date: 06/01/2021 CLINICAL DATA:  Status post trauma. EXAM: RIGHT INDEX FINGER 2+V COMPARISON:  None. FINDINGS: There is no evidence of acute fracture or dislocation. A small chronic deformity is seen involving the base of the distal phalanx. There is no evidence of arthropathy or other focal bone abnormality. Soft tissues are unremarkable. IMPRESSION: No  acute fracture or dislocation. Electronically Signed   By: Aram Candela M.D.   On: 06/01/2021 17:09    Procedures Procedures   Finger was cleaned using alcohol swabs after which an eye cautery was used to trephinate the nail.  We got an adequate return of blood from the subungual hematoma.  Patient had significant improvement in her pain symptoms after this procedure.  The finger was dressed using cotton followed by a finger splint.  Medications Ordered in ED Medications - No data to display  ED Course  I have reviewed the triage vital signs and the nursing notes.  Pertinent labs & imaging results that were available during my care of the patient were reviewed by me and considered in my medical decision making (see chart for details).    MDM Rules/Calculators/A&P                           Patient received significant relief after nail trephination.  She was encouraged to keep this finger clean and covered, warm water soaks twice daily,.  Follow-up with her PCP for any problems or concerns. Final Clinical Impression(s) / ED Diagnoses Final diagnoses:  Subungual hematoma of digit of hand, initial encounter    Rx / DC Orders ED Discharge Orders          Ordered    traMADol (ULTRAM) 50 MG tablet  Every 6 hours PRN        06/01/21 1804             Burgess Amor, PA-C 06/01/21 1807    Bethann Berkshire, MD 06/01/21 2306

## 2021-08-20 ENCOUNTER — Emergency Department (HOSPITAL_COMMUNITY)
Admission: EM | Admit: 2021-08-20 | Discharge: 2021-08-20 | Disposition: A | Discharge status: Home / Self Care | Payer: PRIVATE HEALTH INSURANCE | Attending: Emergency Medicine | Admitting: Emergency Medicine

## 2021-08-20 ENCOUNTER — Other Ambulatory Visit: Payer: Self-pay

## 2021-08-20 ENCOUNTER — Encounter (HOSPITAL_COMMUNITY): Payer: Self-pay

## 2021-08-20 DIAGNOSIS — R0981 Nasal congestion: Secondary | ICD-10-CM | POA: Diagnosis present

## 2021-08-20 DIAGNOSIS — H1031 Unspecified acute conjunctivitis, right eye: Secondary | ICD-10-CM | POA: Insufficient documentation

## 2021-08-20 DIAGNOSIS — J0100 Acute maxillary sinusitis, unspecified: Secondary | ICD-10-CM | POA: Insufficient documentation

## 2021-08-20 DIAGNOSIS — Z20822 Contact with and (suspected) exposure to covid-19: Secondary | ICD-10-CM | POA: Diagnosis not present

## 2021-08-20 LAB — RESP PANEL BY RT-PCR (FLU A&B, COVID) ARPGX2
Influenza A by PCR: NEGATIVE
Influenza B by PCR: NEGATIVE
SARS Coronavirus 2 by RT PCR: NEGATIVE

## 2021-08-20 MED ORDER — DOXYCYCLINE HYCLATE 100 MG PO CAPS: 100.0000 mg | ORAL_CAPSULE | Freq: Two times a day (BID) | ORAL | 0 refills | Status: DC

## 2021-08-20 MED ORDER — TOBRAMYCIN 0.3 % OP SOLN
1.0000 [drp] | Freq: Once | OPHTHALMIC | Status: AC
  Administered 2021-08-20: 1 [drp] via OPHTHALMIC
  Filled 2021-08-20: qty 5

## 2021-08-20 NOTE — ED Triage Notes (Signed)
Pt reports sore throat since Saturday and fatigue.

## 2021-08-20 NOTE — Discharge Instructions (Signed)
Take the full course of the antibiotics prescribed.  You are also being covered for the conjunctivitis in your right eye.  I want you to apply 1 drop of the antibiotic drop in both eyes (to avoid this infection from spreading to the left eye) every 4 hours while awake for the next 5 days or until the eye redness and drainage has resolved.  As discussed you will also benefit from a decongestant, with your blood pressure concerns I recommend Coricidin brand, you will need to read some labels looking for a decongestant formula.  Other choices and include menthol cough drops, Vicks vapor rub to help with congestion.

## 2021-08-22 NOTE — ED Provider Notes (Signed)
[facility] EMERGENCY DEPARTMENT Provider Note   CSN: [id number] Arrival date & time: 08/20/21  0940     History  Chief Complaint  Patient presents with   Sore Throat    Vanessa [name] is a 56 y.o. female presenting for evaluation of suspected sinus infection.  She reports having nasal congestion and drainage which has become purulent, describing green and bloody nasal discharge (no frank nose bleed) along with post nasal drip and sore throat.  She woke for the past 2 days also with right eye redness and matting along her lashes.  She has pressure in her right cheek and forehead.  Subjective fever. No sob, cough, cp. She has found no alleviators, taking zyrtec.  The history is provided by the patient.  Sore Throat Pertinent negatives include no chest pain, no abdominal pain and no shortness of breath.      Home Medications Prior to Admission medications   Medication Sig Start Date End Date Taking? Authorizing Provider  doxycycline (VIBRAMYCIN) 100 MG capsule Take 1 capsule (100 mg total) by mouth 2 (two) times daily for 7 days. 08/20/21 08/27/21 Yes Quaran Kedzierski, Almyra Free, PA-C  cetirizine (ZYRTEC) 10 MG tablet Take 10 mg by mouth as needed.     [provider]  diltiazem (DILT-XR) 240 MG 24 hr capsule Take 240 mg by mouth daily.    [provider]  lisinopril (PRINIVIL,ZESTRIL) 5 MG tablet Take 5 mg by mouth at bedtime.    [provider]  traMADol (ULTRAM) 50 MG tablet Take 1 tablet (50 mg total) by mouth every 6 (six) hours as needed. 06/01/21   Evalee Jefferson, PA-C      Allergies    Patient has no known allergies.    Review of Systems   Review of Systems  Constitutional:  Negative for chills and fever.  HENT:  Positive for congestion, postnasal drip, rhinorrhea, sinus pressure and sore throat. Negative for ear discharge, ear pain, nosebleeds, trouble swallowing and voice change.   Eyes:  Negative for discharge.  Respiratory:  Negative for cough, shortness of  breath, wheezing and stridor.   Cardiovascular:  Negative for chest pain.  Gastrointestinal:  Negative for abdominal pain.  Genitourinary: Negative.   All other systems reviewed and are negative.  Physical Exam Updated Vital Signs BP 131/83 (BP Location: Right Arm)    Pulse 87    Temp 98.4 F (36.9 C) (Oral)    Resp 20    Ht 5\' 7"  (1.702 m)    Wt 60.8 kg    SpO2 98%    BMI 20.99 kg/m  Physical Exam Constitutional:      Appearance: She is well-developed.  HENT:     Head: Normocephalic and atraumatic.     Right Ear: Tympanic membrane and ear canal normal.     Left Ear: Tympanic membrane and ear canal normal.     Nose: Mucosal edema and congestion present. No rhinorrhea.     Right Sinus: Maxillary sinus tenderness and frontal sinus tenderness present.     Mouth/Throat:     Mouth: Mucous membranes are moist.     Pharynx: Oropharynx is clear. Uvula midline. No oropharyngeal exudate or posterior oropharyngeal erythema.     Tonsils: No tonsillar abscesses.  Eyes:     General: Lids are normal.     Conjunctiva/sclera:     Right eye: Right conjunctiva is injected. Exudate present.  Cardiovascular:     Rate and Rhythm: Normal rate.     Heart sounds:  Normal heart sounds.  Pulmonary:     Effort: Pulmonary effort is normal. No respiratory distress.     Breath sounds: No wheezing or rales.  Abdominal:     Palpations: Abdomen is soft.     Tenderness: There is no abdominal tenderness.  Musculoskeletal:        General: Normal range of motion.  Skin:    General: Skin is warm and dry.     Findings: No rash.  Neurological:     Mental Status: She is alert and oriented to person, place, and time.    ED Results / Procedures / Treatments   Labs (all labs ordered are listed, but only abnormal results are displayed) Labs Reviewed  RESP PANEL BY RT-PCR (FLU A&B, COVID) [id number]    EKG None  Radiology No results found.  Procedures Procedures    Medications Ordered in ED Medications   tobramycin (TOBREX) 0.3 % ophthalmic solution 1 drop (1 drop Both Eyes Given 08/20/21 1251)    ED Course/ Medical Decision Making/ A&P                           Medical Decision Making Pt with acute sinusitis with features of right eye conjunctivitis.  Discussed multiple home tx suggestions including flonase for decongestion as she has htn, sudafed not a good choice.  Does not want nasal sprays.  Recommended coricidin brand decongestant, also may try vicks, menthol drops, etc for pressure relief.    Amount and/or Complexity of Data Reviewed Labs: ordered.    Details: respiratory panel was ordered from triage. she is negative for flu and covid  Risk OTC drugs. Prescription drug management. Risk Details: Prescribed doxycycline for acute sinusitis.  Gave tobrex ophthalmic for right eye involvement.  Discussed other home tx as well.  Plan close f/u with pcp or recheck here for any worsened sx.            Final Clinical Impression(s) / ED Diagnoses Final diagnoses:  Acute non-recurrent maxillary sinusitis  Acute conjunctivitis of right eye, unspecified acute conjunctivitis type    Rx / DC Orders ED Discharge Orders          Ordered    doxycycline (VIBRAMYCIN) 100 MG capsule  2 times daily        08/20/21 1211              Evalee Jefferson, PA-C 08/22/21 1454    Daleen Bo, MD 08/24/21 2228

## 2021-08-26 ENCOUNTER — Emergency Department (HOSPITAL_COMMUNITY): Payer: PRIVATE HEALTH INSURANCE

## 2021-08-26 ENCOUNTER — Other Ambulatory Visit: Payer: Self-pay

## 2021-08-26 ENCOUNTER — Encounter (HOSPITAL_COMMUNITY): Payer: Self-pay

## 2021-08-26 ENCOUNTER — Emergency Department (HOSPITAL_COMMUNITY)
Admission: EM | Admit: 2021-08-26 | Discharge: 2021-08-26 | Disposition: A | Discharge status: Home / Self Care | Payer: PRIVATE HEALTH INSURANCE | Attending: Emergency Medicine | Admitting: Emergency Medicine

## 2021-08-26 DIAGNOSIS — J209 Acute bronchitis, unspecified: Secondary | ICD-10-CM

## 2021-08-26 DIAGNOSIS — R079 Chest pain, unspecified: Secondary | ICD-10-CM | POA: Diagnosis not present

## 2021-08-26 DIAGNOSIS — R059 Cough, unspecified: Secondary | ICD-10-CM | POA: Diagnosis present

## 2021-08-26 MED ORDER — PREDNISONE 50 MG PO TABS: ORAL_TABLET | ORAL | 0 refills | Status: DC

## 2021-08-26 MED ORDER — AMOXICILLIN-POT CLAVULANATE 875-125 MG PO TABS: 1.0000 | ORAL_TABLET | Freq: Two times a day (BID) | ORAL | 0 refills | Status: AC

## 2021-08-26 MED ORDER — AMOXICILLIN-POT CLAVULANATE 875-125 MG PO TABS: 1.0000 | ORAL_TABLET | Freq: Two times a day (BID) | ORAL | 0 refills | Status: DC

## 2021-08-26 NOTE — ED Provider Notes (Signed)
[facility] EMERGENCY DEPARTMENT Provider Note   CSN: [id number] Arrival date & time: 08/26/21  1543     History  Chief Complaint  Patient presents with   Cough    Vanessa [name] is a 56 y.o. female.  Pt reports she was seen here 6 days ago for an eye infection.  Pt reports difficulty getting drops in her eye   The history is provided by the patient. No language interpreter was used.  Cough Cough characteristics:  Productive Sputum characteristics:  Nondescript Severity:  Moderate Onset quality:  Gradual Duration:  1 week Timing:  Constant Progression:  Worsening Chronicity:  New Smoker: no   Context: not sick contacts   Relieved by:  Nothing Worsened by:  Nothing Ineffective treatments:  None tried Associated symptoms: no chest pain, no chills, no ear pain, no fever, no rash, no shortness of breath and no sore throat       Home Medications Prior to Admission medications   Medication Sig Start Date End Date Taking? Authorizing Provider  cetirizine (ZYRTEC) 10 MG tablet Take 10 mg by mouth as needed.     [provider]  diltiazem (DILT-XR) 240 MG 24 hr capsule Take 240 mg by mouth daily.    [provider]  doxycycline (VIBRAMYCIN) 100 MG capsule Take 1 capsule (100 mg total) by mouth 2 (two) times daily for 7 days. 08/20/21 08/27/21  Evalee Jefferson, PA-C  lisinopril (PRINIVIL,ZESTRIL) 5 MG tablet Take 5 mg by mouth at bedtime.    [provider]  traMADol (ULTRAM) 50 MG tablet Take 1 tablet (50 mg total) by mouth every 6 (six) hours as needed. 06/01/21   Evalee Jefferson, PA-C      Allergies    Patient has no known allergies.    Review of Systems   Review of Systems  Constitutional:  Negative for chills and fever.  HENT:  Negative for ear pain and sore throat.   Eyes:  Negative for pain and visual disturbance.  Respiratory:  Positive for cough. Negative for shortness of breath.   Cardiovascular:  Negative for chest pain.  Gastrointestinal:   Negative for vomiting.  Skin:  Negative for color change and rash.  All other systems reviewed and are negative.  Physical Exam Updated Vital Signs BP (!) 155/107 (BP Location: Left Arm)    Pulse 70    Temp 98.3 F (36.8 C) (Oral)    Resp 18    Ht 5\' 7"  (1.702 m)    Wt 61.2 kg    SpO2 98%    BMI 21.14 kg/m  Physical Exam Vitals reviewed.  Constitutional:      Appearance: Normal appearance.  HENT:     Head: Normocephalic and atraumatic.     Nose: Nose normal.     Mouth/Throat:     Mouth: Mucous membranes are moist.  Eyes:     Pupils: Pupils are equal, round, and reactive to light.  Cardiovascular:     Rate and Rhythm: Normal rate and regular rhythm.  Pulmonary:     Effort: Pulmonary effort is normal.  Musculoskeletal:        General: Normal range of motion.  Skin:    General: Skin is warm.  Neurological:     General: No focal deficit present.     Mental Status: She is alert.  Psychiatric:        Mood and Affect: Mood normal.    ED Results / Procedures / Treatments   Labs (all labs  ordered are listed, but only abnormal results are displayed) Labs Reviewed - No data to display  EKG EKG Interpretation  Date/Time:  Monday August 26 2021 16:16:12 EST Ventricular Rate:  64 PR Interval:  144 QRS Duration: 80 QT Interval:  594 QTC Calculation: 612 R Axis:   18 Text Interpretation: Normal sinus rhythm Nonspecific ST and T wave abnormality Prolonged QT Abnormal ECG When compared with ECG of 25-Sep-2019 19:57, PREVIOUS ECG IS PRESENT Persistent prolonged QT compared to old EKG Confirmed by Fredia Sorrow [phone]) on 08/26/2021 4:25:24 PM  Radiology DG Chest Port 1 View  Result Date: 08/26/2021 CLINICAL DATA:  Chest pain, cough for several days EXAM: PORTABLE CHEST 1 VIEW COMPARISON:  Chest radiograph 09/25/2019 FINDINGS: The cardiomediastinal silhouette is normal. There is no focal consolidation or pulmonary edema. There is no pleural effusion or pneumothorax. Symmetric  subcentimeter rounded opacities projecting over the upper lobes are most likely artifactual related to clothing external to the patient. There is no acute osseous abnormality. IMPRESSION: No radiographic evidence of acute cardiopulmonary process. Electronically Signed   By: Valetta Mole M.D.   On: 08/26/2021 16:39    Procedures Procedures    Medications Ordered in ED Medications - No data to display  ED Course/ Medical Decision Making/ A&P                           Medical Decision Making Problems Addressed: Acute bronchitis, unspecified organism: acute illness or injury    Details: cough for over 2 weeks  Amount and/or Complexity of Data Reviewed Radiology: ordered and independent interpretation performed. Decision-making details documented in ED Course.    Details: Chest xray  no pneumonia  Risk OTC drugs. Prescription drug management.           Final Clinical Impression(s) / ED Diagnoses Final diagnoses:  Chest pain    Rx / DC Orders ED Discharge Orders     None     An After Visit Summary was printed and given to the patient.    Fransico Meadow, PA-C 08/26/21 1709    Fredia Sorrow, MD 09/01/21 1606

## 2021-08-26 NOTE — ED Triage Notes (Signed)
Pt c/o head and nasal congestion last week.  Also reports diagnosed with conjunctivitis in r eye.  Pt says now congestion has moved to chest.  C/O chest pain, worse with coughing and deep breaths.

## 2021-09-18 ENCOUNTER — Emergency Department (HOSPITAL_COMMUNITY)
Admission: EM | Admit: 2021-09-18 | Discharge: 2021-09-18 | Disposition: A | Discharge status: Home / Self Care | Payer: PRIVATE HEALTH INSURANCE | Attending: Emergency Medicine | Admitting: Emergency Medicine

## 2021-09-18 ENCOUNTER — Emergency Department (HOSPITAL_COMMUNITY): Payer: PRIVATE HEALTH INSURANCE

## 2021-09-18 ENCOUNTER — Other Ambulatory Visit: Payer: Self-pay

## 2021-09-18 DIAGNOSIS — R001 Bradycardia, unspecified: Secondary | ICD-10-CM | POA: Diagnosis not present

## 2021-09-18 DIAGNOSIS — R072 Precordial pain: Secondary | ICD-10-CM | POA: Diagnosis present

## 2021-09-18 DIAGNOSIS — M79642 Pain in left hand: Secondary | ICD-10-CM | POA: Insufficient documentation

## 2021-09-18 DIAGNOSIS — I10 Essential (primary) hypertension: Secondary | ICD-10-CM | POA: Diagnosis not present

## 2021-09-18 DIAGNOSIS — Z79899 Other long term (current) drug therapy: Secondary | ICD-10-CM | POA: Insufficient documentation

## 2021-09-18 LAB — PROTIME-INR
INR: 0.9 (ref 0.8–1.2)
Prothrombin Time: 12.1 seconds (ref 11.4–15.2)

## 2021-09-18 LAB — BASIC METABOLIC PANEL
Anion gap: 9 (ref 5–15)
BUN: 8 mg/dL (ref 6–20)
CO2: 24 mmol/L (ref 22–32)
Calcium: 9.2 mg/dL (ref 8.9–10.3)
Chloride: 108 mmol/L (ref 98–111)
Creatinine, Ser: 0.9 mg/dL (ref 0.44–1.00)
GFR, Estimated: >60 mL/min (ref >60)
Glucose, Bld: 98 mg/dL (ref 70–99)
Potassium: 3.8 mmol/L (ref 3.5–5.1)
Sodium: 141 mmol/L (ref 135–145)

## 2021-09-18 LAB — TROPONIN I (HIGH SENSITIVITY)
Troponin I (High Sensitivity): 2 ng/L (ref <18)
Troponin I (High Sensitivity): 2 ng/L (ref <18)

## 2021-09-18 LAB — CBC
HCT: 44.1 % (ref 36.0–46.0)
Hemoglobin: 13.8 g/dL (ref 12.0–15.0)
MCH: 25.9 pg — ABNORMAL LOW (ref 26.0–34.0)
MCHC: 31.3 g/dL (ref 30.0–36.0)
MCV: 82.9 fL (ref 80.0–100.0)
Platelets: 190 10*3/uL (ref 150–400)
RBC: 5.32 MIL/uL — ABNORMAL HIGH (ref 3.87–5.11)
RDW: 14.3 % (ref 11.5–15.5)
WBC: 4.2 10*3/uL (ref 4.0–10.5)
nRBC: 0 % (ref 0.0–0.2)

## 2021-09-18 MED ORDER — ALUM & MAG HYDROXIDE-SIMETH 200-200-20 MG/5ML PO SUSP
30.0000 mL | Freq: Once | ORAL | Status: AC
Start: 2021-09-18 — End: 2021-09-18
  Administered 2021-09-18: 30 mL via ORAL
  Filled 2021-09-18: qty 30

## 2021-09-18 MED ORDER — ACETAMINOPHEN 500 MG PO TABS
1000.0000 mg | ORAL_TABLET | Freq: Once | ORAL | Status: AC
Start: 2021-09-18 — End: 2021-09-18
  Administered 2021-09-18: 1000 mg via ORAL
  Filled 2021-09-18: qty 2

## 2021-09-18 MED ORDER — FAMOTIDINE 20 MG PO TABS
20.0000 mg | ORAL_TABLET | Freq: Once | ORAL | Status: AC
  Administered 2021-09-18: 20 mg via ORAL
  Filled 2021-09-18: qty 1

## 2021-09-18 NOTE — ED Provider Notes (Addendum)
[facility] EMERGENCY DEPARTMENT Provider Note   CSN: [id number] Arrival date & time: 09/18/21  1141     History  Chief Complaint  Patient presents with   Chest Pain    EMBRY LINWOOD is a 56 y.o. female.  Patient c/o midline lower sternal area pain in the past day, symptoms onset this AM at rest, constant, dull, mild-mod, non radiating, not pleuritic. No exertional cp or discomfort. No sob or unusual doe. No fam hx premature cad, no personal hx cad. No chest wall injury or strain. No heartburn. No leg pain or swelling. No recent surgery, trauma, travel or immobility. No hx dvt or pe. No cough or uri symptoms. No back, flank or neck pain. No associated nv or diaphoresis.   The history is provided by the patient and medical records.  Chest Pain Associated symptoms: no abdominal pain, no back pain, no cough, no fever, no headache, no nausea, no palpitations, no shortness of breath and no vomiting       Home Medications Prior to Admission medications   Medication Sig Start Date End Date Taking? Authorizing Provider  cetirizine (ZYRTEC) 10 MG tablet Take 10 mg by mouth as needed.     [provider]  diltiazem (DILT-XR) 240 MG 24 hr capsule Take 240 mg by mouth daily.    [provider]  lisinopril (PRINIVIL,ZESTRIL) 5 MG tablet Take 5 mg by mouth at bedtime.    [provider]  predniSONE (DELTASONE) 50 MG tablet One tablet a day 08/26/21   Fransico Meadow, PA-C  traMADol (ULTRAM) 50 MG tablet Take 1 tablet (50 mg total) by mouth every 6 (six) hours as needed. 06/01/21   Evalee Jefferson, PA-C      Allergies    Patient has no known allergies.    Review of Systems   Review of Systems  Constitutional:  Negative for chills and fever.  HENT:  Negative for sore throat.   Eyes:  Negative for redness.  Respiratory:  Negative for cough and shortness of breath.   Cardiovascular:  Positive for chest pain. Negative for palpitations and leg swelling.   Gastrointestinal:  Negative for abdominal pain, nausea and vomiting.  Genitourinary:  Negative for flank pain.  Musculoskeletal:  Negative for back pain and neck pain.  Skin:  Negative for rash.  Neurological:  Negative for headaches.  Hematological:  Does not bruise/bleed easily.  Psychiatric/Behavioral:  Negative for confusion.    Physical Exam Updated Vital Signs BP (!) 166/92    Pulse (!) 48    Temp 98.2 F (36.8 C) (Oral)    Resp (!) 24    Ht 1.702 m (5\' 7" )    Wt 61 kg    SpO2 100%    BMI 21.06 kg/m  Physical Exam Vitals and nursing note reviewed.  Constitutional:      Appearance: Normal appearance. She is well-developed.  HENT:     Head: Atraumatic.     Nose: Nose normal.     Mouth/Throat:     Mouth: Mucous membranes are moist.  Eyes:     General: No scleral icterus.    Conjunctiva/sclera: Conjunctivae normal.  Neck:     Trachea: No tracheal deviation.  Cardiovascular:     Rate and Rhythm: Normal rate and regular rhythm.     Pulses: Normal pulses.     Heart sounds: Normal heart sounds. No murmur heard.   No friction rub. No gallop.  Pulmonary:     Effort: Pulmonary effort  is normal. No respiratory distress.     Breath sounds: Normal breath sounds.  Chest:     Chest wall: Tenderness present.  Abdominal:     General: Bowel sounds are normal. There is no distension.     Palpations: Abdomen is soft.     Tenderness: There is no abdominal tenderness. There is no guarding.  Genitourinary:    Comments: No cva tenderness.  Musculoskeletal:        General: No swelling or tenderness.     Cervical back: Normal range of motion and neck supple. No rigidity. No muscular tenderness.     Right lower leg: No edema.     Left lower leg: No edema.  Skin:    General: Skin is warm and dry.     Findings: No rash.  Neurological:     Mental Status: She is alert.     Comments: Alert, speech normal.   Psychiatric:        Mood and Affect: Mood normal.    ED Results / Procedures  / Treatments   Labs (all labs ordered are listed, but only abnormal results are displayed) Results for orders placed or performed during the hospital encounter of [id number]  Basic metabolic panel  Result Value Ref Range   Sodium 141 135 - 145 mmol/L   Potassium 3.8 3.5 - 5.1 mmol/L   Chloride 108 98 - 111 mmol/L   CO2 24 22 - 32 mmol/L   Glucose, Bld 98 70 - 99 mg/dL   BUN 8 6 - 20 mg/dL   Creatinine, Ser 0.90 0.44 - 1.00 mg/dL   Calcium 9.2 8.9 - 10.3 mg/dL   GFR, Estimated >60 >60 mL/min   Anion gap 9 5 - 15  CBC  Result Value Ref Range   WBC 4.2 4.0 - 10.5 K/uL   RBC 5.32 (H) 3.87 - 5.11 MIL/uL   Hemoglobin 13.8 12.0 - 15.0 g/dL   HCT 44.1 36.0 - 46.0 %   MCV 82.9 80.0 - 100.0 fL   MCH 25.9 (L) 26.0 - 34.0 pg   MCHC 31.3 30.0 - 36.0 g/dL   RDW 14.3 11.5 - 15.5 %   Platelets 190 150 - 400 K/uL   nRBC 0.0 0.0 - 0.2 %  Protime-INR (order if Patient is taking Coumadin / Warfarin)  Result Value Ref Range   Prothrombin Time 12.1 11.4 - 15.2 seconds   INR 0.9 0.8 - 1.2  Troponin I (High Sensitivity)  Result Value Ref Range   Troponin I (High Sensitivity) 2 <18 ng/L  Troponin I (High Sensitivity)  Result Value Ref Range   Troponin I (High Sensitivity) 2 <18 ng/L   DG Chest 2 View  Result Date: 09/18/2021 CLINICAL DATA:  Chest pain EXAM: CHEST - 2 VIEW COMPARISON:  Radiograph 08/26/2021 FINDINGS: Unchanged cardiomediastinal silhouette. There is no focal airspace disease. No large pleural effusion. No visible pneumothorax. There are no acute osseous abnormalities. Thoracic spondylosis. IMPRESSION: No focal airspace consolidation. Electronically Signed   By: Maurine Simmering M.D.   On: 09/18/2021 12:26   DG Chest Port 1 View  Result Date: 08/26/2021 CLINICAL DATA:  Chest pain, cough for several days EXAM: PORTABLE CHEST 1 VIEW COMPARISON:  Chest radiograph 09/25/2019 FINDINGS: The cardiomediastinal silhouette is normal. There is no focal consolidation or pulmonary edema. There is no  pleural effusion or pneumothorax. Symmetric subcentimeter rounded opacities projecting over the upper lobes are most likely artifactual related to clothing external to the patient. There is no acute  osseous abnormality. IMPRESSION: No radiographic evidence of acute cardiopulmonary process. Electronically Signed   By: Valetta Mole M.D.   On: 08/26/2021 16:39     ED ECG REPORT   Date: 09/18/2021  Rate: 66  Rhythm: normal sinus rhythm  QRS Axis: normal  Intervals: normal  ST/T Wave abnormalities: nonspecific ST/T changes  Conduction Disutrbances:none  Narrative Interpretation:   Old EKG Reviewed: unchanged  I have personally reviewed the EKG tracing  - similar st/t changes on prior ecgs.    Radiology DG Chest 2 View  Result Date: 09/18/2021 CLINICAL DATA:  Chest pain EXAM: CHEST - 2 VIEW COMPARISON:  Radiograph 08/26/2021 FINDINGS: Unchanged cardiomediastinal silhouette. There is no focal airspace disease. No large pleural effusion. No visible pneumothorax. There are no acute osseous abnormalities. Thoracic spondylosis. IMPRESSION: No focal airspace consolidation. Electronically Signed   By: Maurine Simmering M.D.   On: 09/18/2021 12:26    Procedures Procedures    Medications Ordered in ED Medications  famotidine (PEPCID) tablet 20 mg (has no administration in time range)  alum & mag hydroxide-simeth (MAALOX/MYLANTA) 200-200-20 MG/5ML suspension 30 mL (has no administration in time range)  acetaminophen (TYLENOL) tablet 1,000 mg (has no administration in time range)    ED Course/ Medical Decision Making/ A&P                           Medical Decision Making Problems Addressed: Essential hypertension: chronic illness or injury with exacerbation, progression, or side effects of treatment Precordial chest pain: acute illness or injury that poses a threat to life or bodily functions Sinus bradycardia: chronic illness or injury  Amount and/or Complexity of Data Reviewed External Data  Reviewed: notes. Labs: ordered. Decision-making details documented in ED Course. Radiology: ordered and independent interpretation performed. Decision-making details documented in ED Course. ECG/medicine tests: ordered and independent interpretation performed. Decision-making details documented in ED Course.  Risk OTC drugs.   Iv ns. Continuous pulse ox and cardiac monitoring. Ecg. Labs. Cxr. Considered dispo decision, potential admission re cp - will get trops and reassess.   Reviewed nursing notes and prior charts for additional history. External reports reviewed - prior stress test neg for ischemia.   Labs reviewed/interpreted by me - initial trop normal.   CXR reviewed/interpreted by me - no pna.  Cardiac monitorL sinus rhythm, rate 68.   Additional labs reviewed/interpreted by me-  delta trop normal.   Pepcid po, maalox po, acetaminophen po.  Recheck pt, comfortable, no distress. Currently hr 56, rr 14, pulse ox 100%. Pt indicates hr always runs to low side. Denies faintness or dizziness, ambulatory about ED, indicates feels ready for d/c.   Rec close outpatient card f/u.  Return precautions provided.           Final Clinical Impression(s) / ED Diagnoses Final diagnoses:  None    Rx / DC Orders ED Discharge Orders     None             Lajean Saver, MD 09/18/21 1839

## 2021-09-18 NOTE — ED Notes (Signed)
MD notified re: pts bradycardia and HR in mid 40s consistently

## 2021-09-18 NOTE — ED Triage Notes (Signed)
Left anterior chest pain that is worse with movement and deep breath, started 20 minutes pta. Pt anxious in triage. Reports it is difficult to take a deep breath. Denies nausea or diaphoresis. No hx of similar.

## 2021-09-18 NOTE — ED Provider Triage Note (Signed)
Emergency Medicine Provider Triage Evaluation Note  Vanessa [name] , a 56 y.o. female  was evaluated in triage.  Pt complains of chest pain.  Pt reports pain began after lifting a bag.  Review of Systems  Positive: Pain with moving and lifting right arm Negative: No fever or cough  Physical Exam  BP (!) 155/99 (BP Location: Right Arm)    Pulse 82    Temp 98.2 F (36.8 C) (Oral)    Resp 16    Ht 5\' 7"  (1.702 m)    Wt 61 kg    SpO2 98%    BMI 21.06 kg/m  Gen:   Awake, no distress   Resp:  Normal effort  MSK:   Moves extremities without difficulty  Other:    Medical Decision Making  Medically screening exam initiated at 1:24 PM.  Appropriate orders placed.  was informed that the remainder of the evaluation will be completed by another provider, this initial triage assessment does not replace that evaluation, and the importance of remaining in the ED until their evaluation is complete.     Lynett Fish, Elson Areas 09/18/21 1325

## 2021-09-18 NOTE — Discharge Instructions (Addendum)
It was our pleasure to provide your ER care today - we hope that you feel better.  If heartburn/reflux symptoms, try taking pepcid/maalox as need. You may also take acetaminophen or ibuprofen as need.   Follow up closely with cardiologist this coming week - call office tomorrow AM to arrange appointment. Also have your blood pressure rechecked then, as it is high today.   Return to ER if worse, new symptoms, recurrent or persistent chest pain, increased trouble breathing, weak/fainting, or other concern.

## 2022-04-03 DIAGNOSIS — I10 Essential (primary) hypertension: Secondary | ICD-10-CM | POA: Diagnosis not present

## 2022-04-03 DIAGNOSIS — Z0001 Encounter for general adult medical examination with abnormal findings: Secondary | ICD-10-CM | POA: Diagnosis not present

## 2022-07-03 ENCOUNTER — Emergency Department (HOSPITAL_COMMUNITY)
Admission: EM | Admit: 2022-07-03 | Discharge: 2022-07-03 | Disposition: A | Discharge status: Home / Self Care | Payer: 59 | Attending: Emergency Medicine | Admitting: Emergency Medicine

## 2022-07-03 ENCOUNTER — Encounter (HOSPITAL_COMMUNITY): Payer: Self-pay

## 2022-07-03 ENCOUNTER — Other Ambulatory Visit: Payer: Self-pay

## 2022-07-03 DIAGNOSIS — J040 Acute laryngitis: Secondary | ICD-10-CM | POA: Insufficient documentation

## 2022-07-03 DIAGNOSIS — R0981 Nasal congestion: Secondary | ICD-10-CM | POA: Diagnosis not present

## 2022-07-03 DIAGNOSIS — Z1152 Encounter for screening for COVID-19: Secondary | ICD-10-CM | POA: Insufficient documentation

## 2022-07-03 LAB — RESP PANEL BY RT-PCR (FLU A&B, COVID) ARPGX2
Influenza A by PCR: NEGATIVE
Influenza B by PCR: NEGATIVE
SARS Coronavirus 2 by RT PCR: NEGATIVE

## 2022-07-03 MED ORDER — PREDNISONE 20 MG PO TABS: 40.0000 mg | ORAL_TABLET | Freq: Every day | ORAL | 0 refills | Status: DC

## 2022-07-03 MED ORDER — BENZONATATE 100 MG PO CAPS: 100.0000 mg | ORAL_CAPSULE | Freq: Three times a day (TID) | ORAL | 0 refills | Status: DC | PRN

## 2022-07-03 NOTE — ED Provider Notes (Signed)
[facility] EMERGENCY DEPARTMENT Provider Note   CSN: [id number] Arrival date & time: 07/03/22  0419     History  Chief Complaint  Patient presents with   URI    Vanessa [name] is a 56 y.o. female.  Patient presents to the emergency [facility] for evaluation of nasal congestion, sinus pain, cough.  Patient reports that symptoms have been present for 5 days.  Sinus pressure continues and she now has lost her voice.  Cough is nonproductive.  No shortness of breath.  No fever.       Home Medications Prior to Admission medications   Medication Sig Start Date End Date Taking? Authorizing Provider  benzonatate (TESSALON) 100 MG capsule Take 1 capsule (100 mg total) by mouth 3 (three) times daily as needed for cough. 07/03/22  Yes Jojuan Champney, Canary Brim, MD  predniSONE (DELTASONE) 20 MG tablet Take 2 tablets (40 mg total) by mouth daily with breakfast. 07/03/22  Yes Josely Moffat, Canary Brim, MD  cetirizine (ZYRTEC) 10 MG tablet Take 10 mg by mouth as needed.     [provider]  diltiazem (DILT-XR) 240 MG 24 hr capsule Take 240 mg by mouth daily.    [provider]  lisinopril (PRINIVIL,ZESTRIL) 5 MG tablet Take 5 mg by mouth at bedtime.    [provider]      Allergies    Patient has no known allergies.    Review of Systems   Review of Systems  Physical Exam Updated Vital Signs BP (!) 143/96 (BP Location: Left Arm)   Pulse 97   Temp 98.4 F (36.9 C) (Oral)   Resp 19   Ht 5\' 7"  (1.702 m)   Wt 62 kg   SpO2 96%   BMI 21.41 kg/m  Physical Exam Vitals and nursing note reviewed.  Constitutional:      General: She is not in acute distress.    Appearance: She is well-developed.  HENT:     Head: Normocephalic and atraumatic.     Mouth/Throat:     Mouth: Mucous membranes are moist.     Pharynx: No oropharyngeal exudate.  Eyes:     General: Vision grossly intact. Gaze aligned appropriately.     Extraocular Movements: Extraocular movements intact.      Conjunctiva/sclera: Conjunctivae normal.  Cardiovascular:     Rate and Rhythm: Normal rate and regular rhythm.     Pulses: Normal pulses.     Heart sounds: Normal heart sounds, S1 normal and S2 normal. No murmur heard.    No friction rub. No gallop.  Pulmonary:     Effort: Pulmonary effort is normal. No respiratory distress.     Breath sounds: Normal breath sounds.  Abdominal:     General: Bowel sounds are normal.     Palpations: Abdomen is soft.     Tenderness: There is no abdominal tenderness. There is no guarding or rebound.     Hernia: No hernia is present.  Musculoskeletal:        General: No swelling.     Cervical back: Full passive range of motion without pain, normal range of motion and neck supple. No spinous process tenderness or muscular tenderness. Normal range of motion.     Right lower leg: No edema.     Left lower leg: No edema.  Skin:    General: Skin is warm and dry.     Capillary Refill: Capillary refill takes less than 2 seconds.     Findings: No ecchymosis, erythema, rash  or wound.  Neurological:     General: No focal deficit present.     Mental Status: She is alert and oriented to person, place, and time.     GCS: GCS eye subscore is 4. GCS verbal subscore is 5. GCS motor subscore is 6.     Cranial Nerves: Cranial nerves 2-12 are intact.     Sensory: Sensation is intact.     Motor: Motor function is intact.     Coordination: Coordination is intact.  Psychiatric:        Attention and Perception: Attention normal.        Mood and Affect: Mood normal.        Speech: Speech normal.        Behavior: Behavior normal.     ED Results / Procedures / Treatments   Labs (all labs ordered are listed, but only abnormal results are displayed) Labs Reviewed  RESP PANEL BY RT-PCR (FLU A&B, COVID) ARPGX2    EKG None  Radiology No results found.  Procedures Procedures    Medications Ordered in ED Medications - No data to display  ED Course/ Medical  Decision Making/ A&P                           Medical Decision Making  Patient presents with URI symptoms.  Patient appears well, vital signs are normal.  No hypoxia.  No fever.  Oropharyngeal examination is unremarkable other than very hoarse voice.  Lungs are clear to auscultation.  Influenza negative.  COVID-negative.  Treat for viral laryngitis.        Final Clinical Impression(s) / ED Diagnoses Final diagnoses:  Acute laryngitis    Rx / DC Orders ED Discharge Orders          Ordered    predniSONE (DELTASONE) 20 MG tablet  Daily with breakfast        07/03/22 0543    benzonatate (TESSALON) 100 MG capsule  3 times daily PRN        07/03/22 0543              Gilda Crease, MD 07/03/22 [phone]

## 2022-07-03 NOTE — ED Triage Notes (Signed)
POV from home. Cc of cough, congestion for a few days. Has lost her voice now. 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

## 2022-09-20 ENCOUNTER — Emergency Department (HOSPITAL_COMMUNITY)
Admission: EM | Admit: 2022-09-20 | Discharge: 2022-09-20 | Disposition: A | Discharge status: Home / Self Care | Payer: 59 | Attending: Emergency Medicine | Admitting: Emergency Medicine

## 2022-09-20 ENCOUNTER — Other Ambulatory Visit: Payer: Self-pay

## 2022-09-20 ENCOUNTER — Encounter (HOSPITAL_COMMUNITY): Payer: Self-pay

## 2022-09-20 DIAGNOSIS — Z20822 Contact with and (suspected) exposure to covid-19: Secondary | ICD-10-CM | POA: Diagnosis not present

## 2022-09-20 DIAGNOSIS — R059 Cough, unspecified: Secondary | ICD-10-CM | POA: Diagnosis present

## 2022-09-20 DIAGNOSIS — I10 Essential (primary) hypertension: Secondary | ICD-10-CM | POA: Diagnosis not present

## 2022-09-20 LAB — RESP PANEL BY RT-PCR (RSV, FLU A&B, COVID)  RVPGX2
Influenza A by PCR: NEGATIVE
Influenza B by PCR: NEGATIVE
Resp Syncytial Virus by PCR: NEGATIVE
SARS Coronavirus 2 by RT PCR: NEGATIVE

## 2022-09-20 NOTE — Discharge Instructions (Signed)
Evaluation for your COVID exposure is overall reassuring.  Respiratory panel was negative for COVID, flu and RSV.  You may likely still have an unknown viral respiratory illness.  Recommend conservative treatment at home which includes rest and hydration.  Also recommend that you follow-up with your PCP.  If you have new chest pain and shortness of breath, new calf tenderness or any other concern please return the emergency department for the evaluation.

## 2022-09-20 NOTE — ED Triage Notes (Signed)
Pt reports she was exposed to covid and now has a mild cough, chest tightness and nasal congestion.

## 2022-09-20 NOTE — ED Provider Notes (Signed)
[facility] EMERGENCY DEPARTMENT AT [facility] Provider Note   CSN: [id number] Arrival date & time: 09/20/22  1608     History  Chief Complaint  Patient presents with   Covid Exposure   HPI Vanessa [name] is a 57 y.o. female with hypertension presenting for COVID exposure.  States that multiple coworkers have had COVID.  She developed a cough and congestion and mild sore throat yesterday along with some chest discomfort.  Chest discomfort is in the center of her chest, nonradiating and worse with coughing.  Also nonexertional.  Also stated that her she has been somewhat short of breath.  She has not take any medications to treat her symptoms.  Denies calf tenderness, long trips and recent surgeries.   HPI     Home Medications Prior to Admission medications   Medication Sig Start Date End Date Taking? Authorizing Provider  benzonatate (TESSALON) 100 MG capsule Take 1 capsule (100 mg total) by mouth 3 (three) times daily as needed for cough. 07/03/22   Gilda Crease, MD  cetirizine (ZYRTEC) 10 MG tablet Take 10 mg by mouth as needed.     [provider]  diltiazem (DILT-XR) 240 MG 24 hr capsule Take 240 mg by mouth daily.    [provider]  lisinopril (PRINIVIL,ZESTRIL) 5 MG tablet Take 5 mg by mouth at bedtime.    [provider]  predniSONE (DELTASONE) 20 MG tablet Take 2 tablets (40 mg total) by mouth daily with breakfast. 07/03/22   Pollina, Canary Brim, MD      Allergies    Patient has no known allergies.    Review of Systems   Review of Systems  HENT:  Positive for congestion.   Respiratory:  Positive for cough.     Physical Exam Updated Vital Signs BP (!) 161/89 (BP Location: Right Arm)   Pulse 65   Temp 98.4 F (36.9 C) (Oral)   Resp 17   Ht 5\' 7"  (1.702 m)   Wt 61.2 kg   SpO2 99%   BMI 21.14 kg/m  Physical Exam Vitals and nursing note reviewed.  HENT:     Head: Normocephalic and atraumatic.      Mouth/Throat:     Mouth: Mucous membranes are moist.  Eyes:     General:        Right eye: No discharge.        Left eye: No discharge.     Conjunctiva/sclera: Conjunctivae normal.  Cardiovascular:     Rate and Rhythm: Normal rate and regular rhythm.     Pulses: Normal pulses.     Heart sounds: Normal heart sounds.  Pulmonary:     Effort: Pulmonary effort is normal.     Breath sounds: Normal breath sounds.  Abdominal:     General: Abdomen is flat.     Palpations: Abdomen is soft.  Skin:    General: Skin is warm and dry.  Neurological:     General: No focal deficit present.  Psychiatric:        Mood and Affect: Mood normal.     ED Results / Procedures / Treatments   Labs (all labs ordered are listed, but only abnormal results are displayed) Labs Reviewed  RESP PANEL BY RT-PCR (RSV, FLU A&B, COVID)  [id number]    EKG None  Radiology No results found.  Procedures Procedures    Medications Ordered in ED Medications - No data to display  ED Course/ Medical Decision Making/ A&P  Medical Decision Making  57 year old female who is well-appearing and hemodynamic stable presenting for COVID exposure.  Exam is unremarkable.   Respiratory panel was negative.  Doubt ACS given chest pain started after coughing, is nonexertional and nonradiating.  Also doubt PE given no calf tenderness no tachycardia and O2 sats are within normal limits. Her symptoms are likely related to URI.  Advised conservative treatment at home.  Discussed return precautions.  Also advised her if she follow-up with her PCP for reevaluation.        Final Clinical Impression(s) / ED Diagnoses Final diagnoses:  Cough, unspecified type    Rx / DC Orders ED Discharge Orders     None         Gareth Eagle, PA-C 09/20/22 1827    Bethann Berkshire, MD 09/21/22 1208

## 2022-11-26 ENCOUNTER — Encounter (HOSPITAL_COMMUNITY): Payer: Self-pay | Admitting: *Deleted

## 2022-11-26 ENCOUNTER — Other Ambulatory Visit: Payer: Self-pay

## 2022-11-26 ENCOUNTER — Emergency Department (HOSPITAL_COMMUNITY): Payer: Self-pay

## 2022-11-26 ENCOUNTER — Emergency Department (HOSPITAL_COMMUNITY)
Admission: EM | Admit: 2022-11-26 | Discharge: 2022-11-26 | Disposition: A | Discharge status: Home / Self Care | Payer: Self-pay | Attending: Emergency Medicine | Admitting: Emergency Medicine

## 2022-11-26 DIAGNOSIS — R42 Dizziness and giddiness: Secondary | ICD-10-CM | POA: Insufficient documentation

## 2022-11-26 DIAGNOSIS — I10 Essential (primary) hypertension: Secondary | ICD-10-CM | POA: Insufficient documentation

## 2022-11-26 DIAGNOSIS — Z1152 Encounter for screening for COVID-19: Secondary | ICD-10-CM | POA: Insufficient documentation

## 2022-11-26 DIAGNOSIS — Z79899 Other long term (current) drug therapy: Secondary | ICD-10-CM | POA: Insufficient documentation

## 2022-11-26 LAB — CBC WITH DIFFERENTIAL/PLATELET
Abs Immature Granulocytes: 0.02 10*3/uL (ref 0.00–0.07)
Basophils Absolute: 0.1 10*3/uL (ref 0.0–0.1)
Basophils Relative: 1 %
Eosinophils Absolute: 0.1 10*3/uL (ref 0.0–0.5)
Eosinophils Relative: 3 %
HCT: 44.1 % (ref 36.0–46.0)
Hemoglobin: 14.5 g/dL (ref 12.0–15.0)
Immature Granulocytes: 1 %
Lymphocytes Relative: 32 %
Lymphs Abs: 1.3 10*3/uL (ref 0.7–4.0)
MCH: 26.7 pg (ref 26.0–34.0)
MCHC: 32.9 g/dL (ref 30.0–36.0)
MCV: 81.2 fL (ref 80.0–100.0)
Monocytes Absolute: 0.3 10*3/uL (ref 0.1–1.0)
Monocytes Relative: 9 %
Neutro Abs: 2.2 10*3/uL (ref 1.7–7.7)
Neutrophils Relative %: 54 %
Platelets: 206 10*3/uL (ref 150–400)
RBC: 5.43 MIL/uL — ABNORMAL HIGH (ref 3.87–5.11)
RDW: 13.6 % (ref 11.5–15.5)
WBC: 4 10*3/uL (ref 4.0–10.5)
nRBC: 0 % (ref 0.0–0.2)

## 2022-11-26 LAB — COMPREHENSIVE METABOLIC PANEL
ALT: 14 U/L (ref 0–44)
AST: 17 U/L (ref 15–41)
Albumin: 3.9 g/dL (ref 3.5–5.0)
Alkaline Phosphatase: 91 U/L (ref 38–126)
Anion gap: 7 (ref 5–15)
BUN: 12 mg/dL (ref 6–20)
CO2: 27 mmol/L (ref 22–32)
Calcium: 9.2 mg/dL (ref 8.9–10.3)
Chloride: 104 mmol/L (ref 98–111)
Creatinine, Ser: 0.95 mg/dL (ref 0.44–1.00)
GFR, Estimated: >60 mL/min (ref >60)
Glucose, Bld: 92 mg/dL (ref 70–99)
Potassium: 3.8 mmol/L (ref 3.5–5.1)
Sodium: 138 mmol/L (ref 135–145)
Total Bilirubin: 0.9 mg/dL (ref 0.3–1.2)
Total Protein: 7.1 g/dL (ref 6.5–8.1)

## 2022-11-26 LAB — TROPONIN I (HIGH SENSITIVITY): Troponin I (High Sensitivity): 2 ng/L (ref <18)

## 2022-11-26 LAB — SARS CORONAVIRUS 2 BY RT PCR: SARS Coronavirus 2 by RT PCR: NEGATIVE

## 2022-11-26 MED ORDER — IOHEXOL 350 MG/ML SOLN
75.0000 mL | Freq: Once | INTRAVENOUS | Status: AC | PRN
  Administered 2022-11-26: 75 mL via INTRAVENOUS

## 2022-11-26 MED ORDER — MECLIZINE HCL 12.5 MG PO TABS
25.0000 mg | ORAL_TABLET | Freq: Once | ORAL | Status: AC
Start: 2022-11-26 — End: 2022-11-26
  Administered 2022-11-26: 25 mg via ORAL
  Filled 2022-11-26: qty 2

## 2022-11-26 MED ORDER — MECLIZINE HCL 25 MG PO TABS: 25.0000 mg | ORAL_TABLET | Freq: Three times a day (TID) | ORAL | 0 refills | Status: AC | PRN

## 2022-11-26 MED ORDER — SODIUM CHLORIDE 0.9 % IV BOLUS
1000.0000 mL | Freq: Once | INTRAVENOUS | Status: AC
  Administered 2022-11-26: 1000 mL via INTRAVENOUS

## 2022-11-26 NOTE — ED Triage Notes (Signed)
Pt c/o dizziness and lightheadedness with some chest pain  Pt states she wants to be tested for an URI because last time she had these sx she had an URI

## 2022-11-26 NOTE — ED Notes (Signed)
Pt away at imaging at this time.

## 2022-11-26 NOTE — ED Notes (Signed)
Pt went to [facility] x2 in last hour

## 2022-11-26 NOTE — Discharge Instructions (Addendum)
Sure to drink plenty of fluids.  Follow-up with your family doctor in regards to your fatigue and dizziness.  If at any point you develop a severe headache, are unable to walk, pass out, have chest pain, shortness of breath, or any other new/concerning symptoms then return to the ER or call 911.

## 2022-11-26 NOTE — ED Provider Notes (Signed)
[place] EMERGENCY DEPARTMENT AT [facility] Provider Note   CSN: [id number] Arrival date & time: 11/26/22  8295     History  Chief Complaint  Patient presents with   Dizziness    Vanessa [name] is a 57 y.o. female.  HPI 57 year old female presents with a chief complaint of dizziness.  She has a history of hypertension, iron deficiency anemia, migraines.  She states that she has felt similarly before when she has had a URI and wonders if she has another.  For the past 3 to 4 days she has been having dizziness, primarily when she first wakes up but then sometimes throughout the day.  Feels like the room is spinning.  No syncope.  She does have an associated headache with it though is pretty mild.  She is also been having atraumatic left sided posterior neck pain that hurts worse when she ranges her neck to the left.  Denies any visual complaints.  No vomiting but she is little nauseated.  She is also had some mild dull chest pain but no shortness of breath.  Slight cough and some postnasal drip over the last day or so.  Denies any focal weakness or numbness.  Recently went to the beach and think she was around some potential sick contacts.  She is also worried it could be from her blood pressure is on check and here her blood pressure is 154/85 and she states she has been compliant with her meds.  Home Medications Prior to Admission medications   Medication Sig Start Date End Date Taking? Authorizing Provider  losartan (COZAAR) 25 MG tablet Take 25 mg by mouth daily. 10/03/22  Yes [provider]  meclizine (ANTIVERT) 25 MG tablet Take 1 tablet (25 mg total) by mouth 3 (three) times daily as needed for dizziness. 11/26/22  Yes Pricilla Loveless, MD  benzonatate (TESSALON) 100 MG capsule Take 1 capsule (100 mg total) by mouth 3 (three) times daily as needed for cough. Patient not taking: Reported on 11/26/2022 07/03/22   Gilda Crease, MD  predniSONE (DELTASONE) 20 MG  tablet Take 2 tablets (40 mg total) by mouth daily with breakfast. Patient not taking: Reported on 11/26/2022 07/03/22   Gilda Crease, MD      Allergies    Patient has no known allergies.    Review of Systems   Review of Systems  Constitutional:  Positive for fatigue. Negative for fever.  Eyes:  Negative for visual disturbance.  Respiratory:  Negative for shortness of breath.   Cardiovascular:  Positive for chest pain.  Gastrointestinal:  Positive for nausea. Negative for abdominal pain and vomiting.  Musculoskeletal:  Positive for neck pain. Negative for neck stiffness.  Neurological:  Positive for dizziness and headaches. Negative for weakness and numbness.    Physical Exam Updated Vital Signs BP (!) 148/83 (BP Location: Right Arm)   Pulse (!) 55   Temp 97.9 F (36.6 C) (Oral)   Resp (!) 23   SpO2 100%  Physical Exam Vitals and nursing note reviewed.  Constitutional:      Appearance: She is well-developed.  HENT:     Head: Normocephalic and atraumatic.     Right Ear: Tympanic membrane normal.     Left Ear: Tympanic membrane normal.  Eyes:     Extraocular Movements: Extraocular movements intact.     Pupils: Pupils are equal, round, and reactive to light.  Neck:   Cardiovascular:     Rate and Rhythm: Normal  rate and regular rhythm.     Heart sounds: Normal heart sounds.  Pulmonary:     Effort: Pulmonary effort is normal.     Breath sounds: Normal breath sounds.  Abdominal:     Palpations: Abdomen is soft.     Tenderness: There is no abdominal tenderness.  Musculoskeletal:     Cervical back: Normal range of motion. No rigidity.  Skin:    General: Skin is warm and dry.  Neurological:     Mental Status: She is alert.     Comments: CN 3-12 grossly intact. 5/5 strength in all 4 extremities. Grossly normal sensation. Normal finger to nose. Normal gait.      ED Results / Procedures / Treatments   Labs (all labs ordered are listed, but only abnormal  results are displayed) Labs Reviewed  CBC WITH DIFFERENTIAL/PLATELET - Abnormal; Notable for the following components:      Result Value   RBC 5.43 (*)    All other components within normal limits  SARS CORONAVIRUS 2 BY RT PCR  COMPREHENSIVE METABOLIC PANEL  TROPONIN I (HIGH SENSITIVITY)    EKG EKG Interpretation  Date/Time:  Wednesday November 26 2022 09:32:34 EDT Ventricular Rate:  57 PR Interval:  150 QRS Duration: 143 QT Interval:  569 QTC Calculation: 555 R Axis:   25 Text Interpretation: Sinus rhythm Nonspecific intraventricular conduction delay nonspecific T waves similar to Feb 2024 Confirmed by Pricilla Loveless [phone]) on 11/26/2022 9:38:24 AM  Radiology CT ANGIO HEAD NECK W WO CM  Result Date: 11/26/2022 CLINICAL DATA:  Provided history: Vertebral artery dissection suspected. Dizziness. Lightheadedness. Chest pain. EXAM: CT ANGIOGRAPHY HEAD AND NECK WITH AND WITHOUT CONTRAST TECHNIQUE: Multidetector CT imaging of the head and neck was performed using the standard protocol during bolus administration of intravenous contrast. Multiplanar CT image reconstructions and MIPs were obtained to evaluate the vascular anatomy. Carotid stenosis measurements (when applicable) are obtained utilizing NASCET criteria, using the distal internal carotid diameter as the denominator. RADIATION DOSE REDUCTION: This exam was performed according to the departmental dose-optimization program which includes automated exposure control, adjustment of the mA and/or kV according to patient size and/or use of iterative reconstruction technique. CONTRAST:  75mL OMNIPAQUE IOHEXOL 350 MG/ML SOLN COMPARISON:  No pertinent prior exams available for comparison. FINDINGS: CT HEAD FINDINGS Brain: Cerebral volume is normal. There is no acute intracranial hemorrhage. No demarcated cortical infarct. No extra-axial fluid collection. No evidence of an intracranial mass. No midline shift. Vascular: No hyperdense vessel. Skull:  No fracture or aggressive osseous lesion. Sinuses/Orbits: No orbital mass or acute orbital finding. No significant paranasal sinus disease. 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 Review of the MIP images confirms the above findings CTA NECK FINDINGS Aortic arch: Standard aortic branching. The visualized aortic arch is normal in caliber. Streak and beam hardening artifact arising from a dense left-sided contrast bolus partially obscures the left subclavian artery. Within this limitation, there is no appreciable hemodynamically significant innominate or proximal subclavian artery stenosis. Right carotid system: CCA and ICA patent within the neck without stenosis or significant atherosclerotic disease. No evidence of dissection. Left carotid system: CCA and ICA patent within the neck without stenosis or significant atherosclerotic disease. No evidence of dissection. Vertebral arteries: Streak/beam hardening artifact arising from a prominent venous reflux of contrast limits evaluation of portions of the V1 and V2 vertebral arteries. Within this limitation, the vertebral arteries are patent within the neck without appreciable stenosis or  significant atherosclerotic disease. No evidence of dissection. Skeleton: No acute fracture or aggressive osseous lesion. Other neck: No neck mass or cervical lymphadenopathy. Upper chest: No consolidation within the imaged lung apices. Review of the MIP images confirms the above findings CTA HEAD FINDINGS Anterior circulation: The intracranial internal carotid arteries are patent. The M1 middle cerebral arteries are patent. No M2 proximal branch occlusion or high-grade proximal stenosis. The anterior cerebral arteries are patent. No intracranial aneurysm is identified. Posterior circulation: The intracranial vertebral arteries are patent. The basilar artery is patent. The posterior cerebral arteries are patent. Posterior communicating  arteries are diminutive or absent, bilaterally. Venous sinuses: Within the limitations of contrast timing, no convincing thrombus. Anatomic variants: As described. Review of the MIP images confirms the above findings IMPRESSION: CT head: 1.  No evidence of an acute intracranial abnormality. 2. 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 CTA neck: 1. Streak/beam hardening artifact arising from prominent venous contrast reflux limits evaluation of portions of the V1 and V2 vertebral arteries. Within this limitation, the vertebral arteries are patent within the neck without appreciable stenosis or significant atherosclerotic disease. No evidence of dissection. 2. The common carotid and internal carotid arteries are patent within the neck without stenosis, significant atherosclerotic disease or evidence of dissection. CTA head: No intracranial large vessel occlusion or proximal high-grade arterial stenosis identified. Electronically Signed   By: Jackey Loge D.O.   On: 11/26/2022 13:11   DG Chest 2 View  Result Date: 11/26/2022 CLINICAL DATA:  Chest pain EXAM: CHEST - 2 VIEW COMPARISON:  09/18/2021 FINDINGS: Artifact from EKG leads. Normal heart size and mediastinal contours. No acute infiltrate or edema. No effusion or pneumothorax. No acute osseous findings. IMPRESSION: No evidence of active disease. Electronically Signed   By: Tiburcio Pea M.D.   On: 11/26/2022 10:10    Procedures Procedures    Medications Ordered in ED Medications  sodium chloride 0.9 % bolus 1,000 mL (0 mLs Intravenous Stopped 11/26/22 1153)  meclizine (ANTIVERT) tablet 25 mg (25 mg Oral Given 11/26/22 1021)  iohexol (OMNIPAQUE) 350 MG/ML injection 75 mL (75 mLs Intravenous Contrast Given 11/26/22 1231)    ED Course/ Medical Decision Making/ A&P                             Medical Decision Making Amount and/or Complexity of Data Reviewed Labs: ordered.    Details: No anemia, normal  troponin Radiology: ordered and independent interpretation performed.    Details: CT head unremarkable.  No pneumonia on x-ray ECG/medicine tests: ordered and independent interpretation performed.    Details: Sinus bradycardia but no significant arrhythmia otherwise and no ischemia  Risk Prescription drug management.   She has multiple symptoms, primarily fatigue.  Most of the symptoms have been for the last few days but the fatigue is actually been going on for few weeks.  No focal neurofindings and her workup is overall unremarkable.  She is bradycardic, at times in the 40s but mostly in the 50s.  However chart review shows she has been in the 40s before and she endorses a chronic low heart rate.  She is not on any AV nodal blockers.  At this point she is ambulatory and there are no emergent findings found and so I think she is stable for discharge home with return precautions.  Antivert seem to help so we will give a prescription of this.  Final Clinical Impression(s) / ED Diagnoses Final diagnoses:  Dizziness    Rx / DC Orders ED Discharge Orders          Ordered    meclizine (ANTIVERT) 25 MG tablet  3 times daily PRN        11/26/22 1320              Pricilla Loveless, MD 11/26/22 1529

## 2023-02-12 ENCOUNTER — Emergency Department (HOSPITAL_COMMUNITY)
Admission: EM | Admit: 2023-02-12 | Discharge: 2023-02-12 | Disposition: A | Discharge status: Home / Self Care | Payer: Self-pay | Attending: Emergency Medicine | Admitting: Emergency Medicine

## 2023-02-12 ENCOUNTER — Other Ambulatory Visit: Payer: Self-pay

## 2023-02-12 ENCOUNTER — Encounter (HOSPITAL_COMMUNITY): Payer: Self-pay

## 2023-02-12 DIAGNOSIS — I10 Essential (primary) hypertension: Secondary | ICD-10-CM

## 2023-02-12 DIAGNOSIS — R531 Weakness: Secondary | ICD-10-CM

## 2023-02-12 DIAGNOSIS — R001 Bradycardia, unspecified: Secondary | ICD-10-CM

## 2023-02-12 LAB — URINALYSIS, ROUTINE W REFLEX MICROSCOPIC
Bacteria, UA: NONE SEEN
Bilirubin Urine: NEGATIVE
Glucose, UA: NEGATIVE mg/dL
Ketones, ur: NEGATIVE mg/dL
Leukocytes,Ua: NEGATIVE
Nitrite: NEGATIVE
Protein, ur: NEGATIVE mg/dL
Specific Gravity, Urine: 1.004 — ABNORMAL LOW (ref 1.005–1.030)
pH: 7 (ref 5.0–8.0)

## 2023-02-12 LAB — COMPREHENSIVE METABOLIC PANEL
ALT: 13 U/L (ref 0–44)
AST: 17 U/L (ref 15–41)
Albumin: 3.8 g/dL (ref 3.5–5.0)
Alkaline Phosphatase: 86 U/L (ref 38–126)
Anion gap: 8 (ref 5–15)
BUN: 9 mg/dL (ref 6–20)
CO2: 25 mmol/L (ref 22–32)
Calcium: 9.2 mg/dL (ref 8.9–10.3)
Chloride: 105 mmol/L (ref 98–111)
Creatinine, Ser: 1 mg/dL (ref 0.44–1.00)
GFR, Estimated: >60 mL/min (ref >60)
Glucose, Bld: 97 mg/dL (ref 70–99)
Potassium: 3.4 mmol/L — ABNORMAL LOW (ref 3.5–5.1)
Sodium: 138 mmol/L (ref 135–145)
Total Bilirubin: 1.1 mg/dL (ref 0.3–1.2)
Total Protein: 6.9 g/dL (ref 6.5–8.1)

## 2023-02-12 LAB — CBC
HCT: 42 % (ref 36.0–46.0)
Hemoglobin: 13.8 g/dL (ref 12.0–15.0)
MCH: 26.9 pg (ref 26.0–34.0)
MCHC: 32.9 g/dL (ref 30.0–36.0)
MCV: 81.9 fL (ref 80.0–100.0)
Platelets: 205 10*3/uL (ref 150–400)
RBC: 5.13 MIL/uL — ABNORMAL HIGH (ref 3.87–5.11)
RDW: 13.2 % (ref 11.5–15.5)
WBC: 5.3 10*3/uL (ref 4.0–10.5)
nRBC: 0 % (ref 0.0–0.2)

## 2023-02-12 MED ORDER — POTASSIUM CHLORIDE 20 MEQ PO PACK
40.0000 meq | PACK | Freq: Two times a day (BID) | ORAL | Status: DC
  Administered 2023-02-12: 40 meq via ORAL
  Filled 2023-02-12: qty 2

## 2023-02-12 MED ORDER — MAGNESIUM OXIDE -MG SUPPLEMENT 400 (240 MG) MG PO TABS
800.0000 mg | ORAL_TABLET | Freq: Once | ORAL | Status: AC
  Administered 2023-02-12: 800 mg via ORAL
  Filled 2023-02-12: qty 2

## 2023-02-12 MED ORDER — ACETAMINOPHEN 500 MG PO TABS
1000.0000 mg | ORAL_TABLET | Freq: Once | ORAL | Status: AC
  Administered 2023-02-12: 1000 mg via ORAL
  Filled 2023-02-12: qty 2

## 2023-02-12 MED ORDER — LOSARTAN POTASSIUM 25 MG PO TABS
25.0000 mg | ORAL_TABLET | Freq: Every day | ORAL | Status: DC
  Administered 2023-02-12: 25 mg via ORAL
  Filled 2023-02-12 (×2): qty 1

## 2023-02-12 MED ORDER — POTASSIUM CHLORIDE CRYS ER 20 MEQ PO TBCR
40.0000 meq | EXTENDED_RELEASE_TABLET | Freq: Once | ORAL | Status: AC
  Administered 2023-02-12: 40 meq via ORAL
  Filled 2023-02-12: qty 2

## 2023-02-12 NOTE — ED Provider Notes (Signed)
[facility]ENCY DEPT [facility] Emergency Department Provider Note MRN:  [id number]  Arrival date & time: 02/12/23     Chief Complaint   Hypertension and Fatigue   History of Present Illness   Vanessa [name] is a 57 y.o. year-old female with a history of hypertension presenting to the ED with chief complaint of fatigue.  Fatigue, low energy for the past 2 or 3 days.  Also has not been taking her high blood pressure medicine for the same amount of time.  Feels like her blood pressure is high.  Denies chest pain, no vision loss, no abdominal pain, no recent fever or illness.  Mild dull headache at this time, gradual onset.  Review of Systems  A thorough review of systems was obtained and all systems are negative except as noted in the HPI and PMH.   Patient's Health History    Past Medical History:  Diagnosis Date   Arthritis    Hypertension    Iron deficiency anemia    Migraine    Trichimoniasis     Past Surgical History:  Procedure Laterality Date   ABDOMINAL HYSTERECTOMY     ECTOPIC PREGNANCY SURGERY      Family History  Problem Relation Age of Onset   High blood pressure Mother    Coronary artery disease Mother    Hypertension Mother    Heart attack Mother    Hypertension Sister     Social History   Socioeconomic History   Marital status: Legally Separated    Spouse name: Not on file   Number of children: Not on file   Years of education: Not on file   Highest education level: Not on file  Occupational History   Not on file  Tobacco Use   Smoking status: Never   Smokeless tobacco: Never  Vaping Use   Vaping status: Never Used  Substance and Sexual Activity   Alcohol use: No   Drug use: No   Sexual activity: Not Currently    Birth control/protection: Surgical    Comment: hyst  Other Topics Concern   Not on file  Social History Narrative   Not on file   Social Determinants of Health   Financial Resource Strain: Not on file  Food Insecurity:  Not on file  Transportation Needs: Not on file  Physical Activity: Not on file  Stress: Not on file  Social Connections: Not on file  Intimate Partner Violence: Not on file     Physical Exam   Vitals:   02/12/23 0630 02/12/23 0634  BP: (!) 171/98   Pulse: (!) 52 (!) 58  Resp:  17  SpO2: 98% 98%    CONSTITUTIONAL: Well-appearing, NAD NEURO/PSYCH:  Alert and oriented x 3, normal and symmetric strength and sensation, normal coordination, normal speech EYES:  eyes equal and reactive ENT/NECK:  no LAD, no JVD CARDIO: Regular rate, well-perfused, normal S1 and S2 PULM:  CTAB no wheezing or rhonchi GI/GU:  non-distended, non-tender MSK/SPINE:  No gross deformities, no edema SKIN:  no rash, atraumatic   *Additional and/or pertinent findings included in MDM below  Diagnostic and Interventional Summary    EKG Interpretation Date/Time:  Thursday February 12 2023 06:32:39 EDT Ventricular Rate:  51 PR Interval:  157 QRS Duration:  99 QT Interval:  424 QTC Calculation: 391 R Axis:   15  Text Interpretation: Sinus rhythm Nonspecific T abnormalities, diffuse leads Confirmed by Kennis Carina [phone]) on 02/12/2023 6:37:49 AM       Labs  Reviewed  CBC  COMPREHENSIVE METABOLIC PANEL    No orders to display    Medications  losartan (COZAAR) tablet 25 mg (25 mg Oral Given 02/12/23 0644)  acetaminophen (TYLENOL) tablet 1,000 mg (1,000 mg Oral Given 02/12/23 1610)     Procedures  /  Critical Care Procedures  ED Course and Medical Decision Making  Initial Impression and Ddx Patient feeling vague symptoms of unwellness, low energy, weak, dull headache.  Explains that she has been out in the heat recently and so may be a bit dehydrated.  Other consideration is that she is symptomatic from her high blood pressure, has not been compliant with her medication.  Dull headache is her only other symptom.  Completely normal neurological exam, highly doubt significant intracranial process.   Checking screening labs, providing home dose of antihypertensive, Tylenol, will reassess.  Past medical/surgical history that increases complexity of ED encounter: Hypertension  Interpretation of Diagnostics I personally reviewed the EKG and my interpretation is as follows: Nonspecific ST/T wave findings unchanged from prior  Labs pending  Patient Reassessment and Ultimate Disposition/Management     Signed out to oncoming provider at shift change.  Would anticipate discharge if workup reassuring and blood pressures responds.  Patient management required discussion with the following services or consulting groups:  None  Complexity of Problems Addressed Acute illness or injury that poses threat of life of bodily function  Additional Data Reviewed and Analyzed Further history obtained from: None  Additional Factors Impacting ED Encounter Risk None  Elmer Sow. Pilar Plate, MD [facility] Emergency Medicine [facility] [email or web address]  Final Clinical Impressions(s) / ED Diagnoses     ICD-10-CM   1. Weakness  R53.1     2. Hypertension, unspecified type  I10       ED Discharge Orders     None        Discharge Instructions Discussed with and Provided to Patient:   Discharge Instructions   None      Sabas Sous, MD 02/12/23 [phone]

## 2023-02-12 NOTE — ED Triage Notes (Addendum)
Pt c/o fatigue. States she thinks she may be dehydrated and that her blood pressure is elevated. Pt has not been taking her medication the last few days because she forgets to. Endorses slight headache 3/10.

## 2023-02-12 NOTE — ED Notes (Signed)
Pt was unable to tolerate potassium pills. Powder solution given mixed with water.

## 2023-02-12 NOTE — ED Provider Notes (Signed)
  Physical Exam  BP (!) 152/95   Pulse (!) 43   Temp (!) 97.4 F (36.3 C) (Oral)   Resp 14   SpO2 97%   Physical Exam Vitals and nursing note reviewed.  Constitutional:      General: She is not in acute distress.    Appearance: She is well-developed.  HENT:     Head: Normocephalic and atraumatic.  Eyes:     Conjunctiva/sclera: Conjunctivae normal.  Cardiovascular:     Rate and Rhythm: Regular rhythm. Bradycardia present.     Heart sounds: No murmur heard. Pulmonary:     Effort: Pulmonary effort is normal. No respiratory distress.     Breath sounds: Normal breath sounds.  Abdominal:     Palpations: Abdomen is soft.     Tenderness: There is no abdominal tenderness.  Musculoskeletal:        General: No swelling.     Cervical back: Neck supple.  Skin:    General: Skin is warm and dry.     Capillary Refill: Capillary refill takes less than 2 seconds.  Neurological:     Mental Status: She is alert.  Psychiatric:        Mood and Affect: Mood normal.     Procedures  Procedures  ED Course / MDM    Medical Decision Making Amount and/or Complexity of Data Reviewed Labs: ordered.  Risk OTC drugs. Prescription drug management.   Patient received in handoff.  Hypertension and fatigue pending reevaluation after patient received home dose of hypertensive medication.  On reevaluation, hypertension has resolved and symptoms have improved.  Laboratory evaluation is unremarkable.  Of note, patient does have a sinus bradycardia with rates between mid 40s and upper 50s.  She states that this is baseline for her and not a new finding.  She has not had any exertional syncope, lightheadedness or shortness of breath.  Patient states that she does not need a refill on her medication but has just been too busy and forgot to take it.  She was then discharged with outpatient follow-up as she does not meet inpatient criteria for admission       Glendora Score, MD 02/12/23 [phone]

## 2023-02-12 NOTE — ED Notes (Signed)
HR noted to be 40-45 on ED cardiac monitor. EKG obtained and showed to MD.

## 2023-06-09 ENCOUNTER — Emergency Department (HOSPITAL_COMMUNITY)
Admission: EM | Admit: 2023-06-09 | Discharge: 2023-06-09 | Disposition: A | Discharge status: Home / Self Care | Payer: Self-pay | Attending: Student | Admitting: Student

## 2023-06-09 ENCOUNTER — Encounter (HOSPITAL_COMMUNITY): Payer: Self-pay | Admitting: *Deleted

## 2023-06-09 ENCOUNTER — Emergency Department (HOSPITAL_COMMUNITY): Payer: Self-pay

## 2023-06-09 ENCOUNTER — Other Ambulatory Visit: Payer: Self-pay

## 2023-06-09 DIAGNOSIS — M79604 Pain in right leg: Secondary | ICD-10-CM | POA: Insufficient documentation

## 2023-06-09 DIAGNOSIS — I10 Essential (primary) hypertension: Secondary | ICD-10-CM | POA: Insufficient documentation

## 2023-06-09 DIAGNOSIS — Z79899 Other long term (current) drug therapy: Secondary | ICD-10-CM | POA: Insufficient documentation

## 2023-06-09 MED ORDER — KETOROLAC TROMETHAMINE 15 MG/ML IJ SOLN
15.0000 mg | Freq: Once | INTRAMUSCULAR | Status: AC
  Administered 2023-06-09: 15 mg via INTRAMUSCULAR
  Filled 2023-06-09: qty 1

## 2023-06-09 MED ORDER — KETOROLAC TROMETHAMINE 15 MG/ML IJ SOLN: 15.0000 mg | Freq: Once | INTRAMUSCULAR | Status: DC

## 2023-06-09 MED ORDER — NAPROXEN 500 MG PO TABS: 500.0000 mg | ORAL_TABLET | Freq: Two times a day (BID) | ORAL | 0 refills | Status: DC

## 2023-06-09 NOTE — Discharge Instructions (Signed)
Seen today for pain in your heels, primarily right heel.  This may be due to your prolonged standing or your foot wear.  Recommend anti-inflammatories and ankle brace, rest the ankle, try to keep it elevated, you can use ice several times a day for 15 minutes at a time and take the naproxen as needed for pain.  You to also take Tylenol over-the-counter as needed for pain.  Follow-up with primary care and/orthopedics, come back to the ER if you have swelling, redness, fever or any other new or worsening symptoms.

## 2023-06-09 NOTE — ED Triage Notes (Signed)
Pt with c/o bilateral leg and heel pain but right is worse. Denies any swelling or redness.  Denies any injuries. Pt states she wears steel toed shoes.  Throbbing started yesterday morning.

## 2023-06-09 NOTE — ED Provider Notes (Signed)
[place] EMERGENCY DEPARTMENT AT [facility] Provider Note   CSN: [id number] Arrival date & time: 06/09/23  1029     History  Chief Complaint  Patient presents with   Leg Pain    Vanessa [name] is a 57 y.o. female.  PMH of hypertension.  Presents to ER complaining of pain primarily to the right posterior heel.  It started yesterday while at work.  She stands for long hours wearing steel toe boots.  She states they recently changed the mat she stands on thinks may have triggered the pain.  Denies injury or trauma.  She states the pain yesterday was very severe and she had pain with every step she took.  This morning the pain is less severe.  Denies fevers or chills or swelling.  Denies trauma, no leg swelling.  She has a mild pain on the left side.  States when she was stopped initially the pain radiated to the posterior thigh as well.  No history of VTE, not on any hormonal medications, no history of cancer, no family history of VTE.  Patient is not a smoker.  No recent trauma or surgery.  No recent immobilization.   Leg Pain      Home Medications Prior to Admission medications   Medication Sig Start Date End Date Taking? Authorizing Provider  naproxen (NAPROSYN) 500 MG tablet Take 1 tablet (500 mg total) by mouth 2 (two) times daily. 06/09/23  Yes Ethlyn Alto A, PA-C  benzonatate (TESSALON) 100 MG capsule Take 1 capsule (100 mg total) by mouth 3 (three) times daily as needed for cough. Patient not taking: Reported on 11/26/2022 07/03/22   Gilda Crease, MD  losartan (COZAAR) 25 MG tablet Take 25 mg by mouth daily. 10/03/22   [provider]  meclizine (ANTIVERT) 25 MG tablet Take 1 tablet (25 mg total) by mouth 3 (three) times daily as needed for dizziness. 11/26/22   Pricilla Loveless, MD  predniSONE (DELTASONE) 20 MG tablet Take 2 tablets (40 mg total) by mouth daily with breakfast. Patient not taking: Reported on 11/26/2022 07/03/22   Gilda Crease, MD      Allergies    Patient has no known allergies.    Review of Systems   Review of Systems  Physical Exam Updated Vital Signs BP (!) 149/95   Pulse (!) 59   Temp 98.9 F (37.2 C) (Oral)   Resp 17   Ht 5\' 7"  (1.702 m)   Wt 61.2 kg   SpO2 100%   BMI 21.14 kg/m  Physical Exam Vitals and nursing note reviewed.  Constitutional:      General: She is not in acute distress.    Appearance: She is well-developed.  HENT:     Head: Normocephalic and atraumatic.     Mouth/Throat:     Mouth: Mucous membranes are moist.  Eyes:     Conjunctiva/sclera: Conjunctivae normal.  Cardiovascular:     Rate and Rhythm: Normal rate and regular rhythm.     Heart sounds: No murmur heard. Pulmonary:     Effort: Pulmonary effort is normal. No respiratory distress.     Breath sounds: Normal breath sounds.  Abdominal:     Palpations: Abdomen is soft.     Tenderness: There is no abdominal tenderness.  Musculoskeletal:        General: No swelling. Normal range of motion.     Cervical back: Neck supple.     Right lower leg: No edema.  Left lower leg: No edema.     Comments: Noticed to the right posterior calcaneus at the insertion of the Achilles, mild tenderness of the Achilles, normal plantarflexion with calf squeeze, no calf tenderness or swelling bilateral lower extremities, good of Homans' sign bilaterally  Skin:    General: Skin is warm and dry.     Capillary Refill: Capillary refill takes less than 2 seconds.     Findings: No erythema or rash.  Neurological:     General: No focal deficit present.     Mental Status: She is alert and oriented to person, place, and time.  Psychiatric:        Mood and Affect: Mood normal.     ED Results / Procedures / Treatments   Labs (all labs ordered are listed, but only abnormal results are displayed) Labs Reviewed - No data to display  EKG None  Radiology DG Ankle Complete Right  Result Date: 06/09/2023 CLINICAL DATA:  Pain. No  known trauma. Right-greater-than-left leg and heel pain. EXAM: RIGHT ANKLE - COMPLETE 3+ VIEW COMPARISON:  Right ankle radiographs 06/18/2013 FINDINGS: Normal bone mineralization. Joint spaces are preserved. No acute fracture or dislocation. The cortices are intact. IMPRESSION: Normal right ankle radiographs. Electronically Signed   By: Neita Garnet M.D.   On: 06/09/2023 14:50    Procedures Procedures    Medications Ordered in ED Medications  ketorolac (TORADOL) 15 MG/ML injection 15 mg (15 mg Intramuscular Given 06/09/23 1246)    ED Course/ Medical Decision Making/ A&P                                 Medical Decision Making Diagnosis: Fracture, tendinitis, bursitis, strain, plantar fasciitis, other  ED course: Patient here for bilateral heel pain but primarily on the right.  Occurred after standing for a long shift for long period of time.  She states they changed the mats on the floors and things could be related.  She does wear steel toed boots at work as well.  She thinks this may also contribute.  There is no swelling or redness, tenderness is over the insertion of the Achilles, this patient likely tendinitis.  No signs of infection.  She has no calf tenderness, no lower extremity edema, negative Homan sign.  Do not suspect VTE she has no respecters for this.  Will treat with anti-inflammatories, rest ice compression elevation have follow-up with PCP and/Ortho.  Advised to come back for new or worsening symptoms.  Amount and/or Complexity of Data Reviewed External Data Reviewed: notes. Radiology: ordered and independent interpretation performed.    Details: X-ray right ankle-no fracture, no malalignment, with radiology read  Risk Prescription drug management.           Final Clinical Impression(s) / ED Diagnoses Final diagnoses:  Right leg pain    Rx / DC Orders ED Discharge Orders          Ordered    naproxen (NAPROSYN) 500 MG tablet  2 times daily        06/09/23  [address], PA-C 06/09/23 1500    Glendora Score, MD 06/09/23 1929

## 2023-06-09 NOTE — ED Notes (Signed)
Aircast applied to right ankle. DC papers reviewed with patient and she voiced understanding.

## 2023-09-09 ENCOUNTER — Ambulatory Visit (HOSPITAL_COMMUNITY)
Admission: RE | Admit: 2023-09-09 | Discharge: 2023-09-09 | Disposition: A | Discharge status: Home / Self Care | Payer: 59 | Source: Ambulatory Visit | Attending: Internal Medicine | Admitting: Internal Medicine

## 2023-09-09 ENCOUNTER — Other Ambulatory Visit (HOSPITAL_COMMUNITY): Payer: Self-pay | Admitting: Internal Medicine

## 2023-09-09 DIAGNOSIS — M47816 Spondylosis without myelopathy or radiculopathy, lumbar region: Secondary | ICD-10-CM | POA: Diagnosis not present

## 2023-09-09 DIAGNOSIS — M2011 Hallux valgus (acquired), right foot: Secondary | ICD-10-CM | POA: Diagnosis not present

## 2023-09-09 DIAGNOSIS — G8929 Other chronic pain: Secondary | ICD-10-CM | POA: Insufficient documentation

## 2023-09-09 DIAGNOSIS — M4310 Spondylolisthesis, site unspecified: Secondary | ICD-10-CM | POA: Diagnosis not present

## 2023-09-09 DIAGNOSIS — M79671 Pain in right foot: Secondary | ICD-10-CM | POA: Insufficient documentation

## 2023-09-09 DIAGNOSIS — M419 Scoliosis, unspecified: Secondary | ICD-10-CM | POA: Diagnosis not present

## 2023-09-09 DIAGNOSIS — M545 Low back pain, unspecified: Secondary | ICD-10-CM | POA: Insufficient documentation

## 2023-09-09 DIAGNOSIS — M5136 Other intervertebral disc degeneration, lumbar region with discogenic back pain only: Secondary | ICD-10-CM | POA: Diagnosis not present

## 2023-09-18 DIAGNOSIS — I10 Essential (primary) hypertension: Secondary | ICD-10-CM | POA: Diagnosis not present

## 2023-09-18 DIAGNOSIS — M79671 Pain in right foot: Secondary | ICD-10-CM | POA: Diagnosis not present

## 2023-09-18 DIAGNOSIS — Z6822 Body mass index (BMI) 22.0-22.9, adult: Secondary | ICD-10-CM | POA: Diagnosis not present

## 2023-09-18 DIAGNOSIS — M5416 Radiculopathy, lumbar region: Secondary | ICD-10-CM | POA: Diagnosis not present

## 2023-10-02 ENCOUNTER — Other Ambulatory Visit (HOSPITAL_COMMUNITY): Payer: Self-pay | Admitting: Family Medicine

## 2023-10-02 DIAGNOSIS — I10 Essential (primary) hypertension: Secondary | ICD-10-CM | POA: Diagnosis not present

## 2023-10-02 DIAGNOSIS — Z1331 Encounter for screening for depression: Secondary | ICD-10-CM | POA: Diagnosis not present

## 2023-10-02 DIAGNOSIS — Z0001 Encounter for general adult medical examination with abnormal findings: Secondary | ICD-10-CM | POA: Diagnosis not present

## 2023-10-02 DIAGNOSIS — Z1231 Encounter for screening mammogram for malignant neoplasm of breast: Secondary | ICD-10-CM

## 2023-10-02 DIAGNOSIS — J302 Other seasonal allergic rhinitis: Secondary | ICD-10-CM | POA: Diagnosis not present

## 2023-10-02 DIAGNOSIS — M5416 Radiculopathy, lumbar region: Secondary | ICD-10-CM | POA: Diagnosis not present

## 2023-10-02 DIAGNOSIS — Z6822 Body mass index (BMI) 22.0-22.9, adult: Secondary | ICD-10-CM | POA: Diagnosis not present

## 2023-10-14 DIAGNOSIS — I10 Essential (primary) hypertension: Secondary | ICD-10-CM | POA: Diagnosis not present

## 2023-10-14 DIAGNOSIS — R6889 Other general symptoms and signs: Secondary | ICD-10-CM | POA: Diagnosis not present

## 2023-10-14 DIAGNOSIS — Z6821 Body mass index (BMI) 21.0-21.9, adult: Secondary | ICD-10-CM | POA: Diagnosis not present

## 2023-10-14 DIAGNOSIS — J0100 Acute maxillary sinusitis, unspecified: Secondary | ICD-10-CM | POA: Diagnosis not present

## 2023-10-14 DIAGNOSIS — Z20828 Contact with and (suspected) exposure to other viral communicable diseases: Secondary | ICD-10-CM | POA: Diagnosis not present

## 2023-10-14 LAB — EXTERNAL GENERIC LAB PROCEDURE

## 2023-10-14 LAB — COLOGUARD

## 2023-10-16 ENCOUNTER — Encounter (HOSPITAL_COMMUNITY): Payer: Self-pay

## 2023-10-16 ENCOUNTER — Ambulatory Visit (HOSPITAL_COMMUNITY)
Admission: RE | Admit: 2023-10-16 | Discharge: 2023-10-16 | Disposition: A | Discharge status: Home / Self Care | Payer: 59 | Source: Ambulatory Visit | Attending: Family Medicine | Admitting: Family Medicine

## 2023-10-16 DIAGNOSIS — Z1231 Encounter for screening mammogram for malignant neoplasm of breast: Secondary | ICD-10-CM | POA: Diagnosis not present

## 2023-12-23 ENCOUNTER — Encounter (HOSPITAL_COMMUNITY): Payer: Self-pay | Admitting: Emergency Medicine

## 2023-12-23 ENCOUNTER — Emergency Department (HOSPITAL_COMMUNITY)

## 2023-12-23 ENCOUNTER — Emergency Department (HOSPITAL_COMMUNITY)
Admission: EM | Admit: 2023-12-23 | Discharge: 2023-12-23 | Disposition: A | Discharge status: Home / Self Care | Attending: Emergency Medicine | Admitting: Emergency Medicine

## 2023-12-23 DIAGNOSIS — M79642 Pain in left hand: Secondary | ICD-10-CM | POA: Diagnosis not present

## 2023-12-23 DIAGNOSIS — S20219A Contusion of unspecified front wall of thorax, initial encounter: Secondary | ICD-10-CM

## 2023-12-23 DIAGNOSIS — M25572 Pain in left ankle and joints of left foot: Secondary | ICD-10-CM | POA: Insufficient documentation

## 2023-12-23 DIAGNOSIS — S20212A Contusion of left front wall of thorax, initial encounter: Secondary | ICD-10-CM | POA: Diagnosis not present

## 2023-12-23 DIAGNOSIS — Z041 Encounter for examination and observation following transport accident: Secondary | ICD-10-CM | POA: Diagnosis not present

## 2023-12-23 DIAGNOSIS — R000 Tachycardia, unspecified: Secondary | ICD-10-CM | POA: Diagnosis not present

## 2023-12-23 DIAGNOSIS — Y92410 Unspecified street and highway as the place of occurrence of the external cause: Secondary | ICD-10-CM | POA: Insufficient documentation

## 2023-12-23 DIAGNOSIS — S299XXA Unspecified injury of thorax, initial encounter: Secondary | ICD-10-CM | POA: Insufficient documentation

## 2023-12-23 DIAGNOSIS — G8911 Acute pain due to trauma: Secondary | ICD-10-CM | POA: Diagnosis not present

## 2023-12-23 DIAGNOSIS — M25519 Pain in unspecified shoulder: Secondary | ICD-10-CM | POA: Diagnosis not present

## 2023-12-23 DIAGNOSIS — I10 Essential (primary) hypertension: Secondary | ICD-10-CM | POA: Diagnosis not present

## 2023-12-23 MED ORDER — LIDOCAINE 5 % EX PTCH: 1.0000 | MEDICATED_PATCH | Freq: Every day | CUTANEOUS | 0 refills | Status: AC | PRN

## 2023-12-23 MED ORDER — OXYCODONE HCL 5 MG PO TABS: 5.0000 mg | ORAL_TABLET | ORAL | 0 refills | Status: DC | PRN

## 2023-12-23 MED ORDER — CYCLOBENZAPRINE HCL 10 MG PO TABS: 10.0000 mg | ORAL_TABLET | Freq: Two times a day (BID) | ORAL | 0 refills | Status: DC | PRN

## 2023-12-23 MED ORDER — ACETAMINOPHEN 325 MG PO TABS: 650.0000 mg | ORAL_TABLET | Freq: Four times a day (QID) | ORAL | 0 refills | Status: AC | PRN

## 2023-12-23 MED ORDER — ACETAMINOPHEN 325 MG PO TABS: 650.0000 mg | ORAL_TABLET | Freq: Four times a day (QID) | ORAL | 0 refills | Status: DC | PRN

## 2023-12-23 MED ORDER — IBUPROFEN 600 MG PO TABS: 600.0000 mg | ORAL_TABLET | Freq: Four times a day (QID) | ORAL | 0 refills | Status: DC | PRN

## 2023-12-23 MED ORDER — OXYCODONE HCL 5 MG PO TABS: 5.0000 mg | ORAL_TABLET | ORAL | 0 refills | Status: AC | PRN

## 2023-12-23 MED ORDER — CYCLOBENZAPRINE HCL 10 MG PO TABS: 10.0000 mg | ORAL_TABLET | Freq: Two times a day (BID) | ORAL | 0 refills | Status: AC | PRN

## 2023-12-23 MED ORDER — IBUPROFEN 600 MG PO TABS: 600.0000 mg | ORAL_TABLET | Freq: Four times a day (QID) | ORAL | 0 refills | Status: AC | PRN

## 2023-12-23 MED ORDER — LIDOCAINE 5 % EX PTCH
1.0000 | MEDICATED_PATCH | Freq: Every day | CUTANEOUS | 0 refills | Status: DC | PRN
Start: 2023-12-23 — End: 2023-12-23

## 2023-12-23 NOTE — Discharge Instructions (Addendum)
 It was a pleasure caring for you today in the emergency department.  Please follow up with your pcp in the next week for recheck of you ankle sprain and rib injury  Please return to the emergency department for any worsening or worrisome symptoms.
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
## 2023-12-23 NOTE — ED Notes (Signed)
 ED Provider at bedside.
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
## 2023-12-24 DIAGNOSIS — S93492A Sprain of other ligament of left ankle, initial encounter: Secondary | ICD-10-CM | POA: Diagnosis not present

## 2023-12-31 DIAGNOSIS — Z6821 Body mass index (BMI) 21.0-21.9, adult: Secondary | ICD-10-CM | POA: Diagnosis not present

## 2023-12-31 DIAGNOSIS — S93402D Sprain of unspecified ligament of left ankle, subsequent encounter: Secondary | ICD-10-CM | POA: Diagnosis not present

## 2023-12-31 DIAGNOSIS — I10 Essential (primary) hypertension: Secondary | ICD-10-CM | POA: Diagnosis not present

## 2024-01-26 ENCOUNTER — Ambulatory Visit (INDEPENDENT_AMBULATORY_CARE_PROVIDER_SITE_OTHER): Admitting: Orthopedic Surgery

## 2024-01-26 ENCOUNTER — Encounter: Payer: Self-pay | Admitting: Orthopedic Surgery

## 2024-01-26 ENCOUNTER — Other Ambulatory Visit (INDEPENDENT_AMBULATORY_CARE_PROVIDER_SITE_OTHER): Payer: Self-pay

## 2024-01-26 VITALS — BP 163/112 | HR 85 | Ht 67.0 in | Wt 136.0 lb

## 2024-01-26 DIAGNOSIS — S93492A Sprain of other ligament of left ankle, initial encounter: Secondary | ICD-10-CM | POA: Diagnosis not present

## 2024-01-26 DIAGNOSIS — M25572 Pain in left ankle and joints of left foot: Secondary | ICD-10-CM

## 2024-01-26 MED ORDER — PREDNISONE 10 MG (21) PO TBPK: ORAL_TABLET | ORAL | 0 refills | Status: DC

## 2024-01-26 NOTE — Patient Instructions (Addendum)
 Note for work - out of work until the next visit   Instructions  1.  You have sustained an ankle sprain, or similar exercises that can be treated as an ankle sprain.  **These exercises can also be used as part of recovery from an ankle fracture.  2.  I encourage you to stay on your feet and gradually remove your walking boot.   3.  Below are some exercises that you can complete on your own to improve your symptoms.  4.  As an alternative, you can search for ankle sprain exercises online, and can see some demonstrations on YouTube  5.  If you are having difficulty with these exercises, we can also prescribe formal physical therapy  Ankle Exercises Ask your health care provider which exercises are safe for you. Do exercises exactly as told by your health care provider and adjust them as directed. It is normal to feel mild stretching, pulling, tightness, or mild discomfort as you do these exercises. Stop right away if you feel sudden pain or your pain gets worse. Do not begin these exercises until told by your health care provider.  Stretching and range-of-motion exercises These exercises warm up your muscles and joints and improve the movement and flexibility of your ankle. These exercises may also help to relieve pain.  Dorsiflexion/plantar flexion  Sit with your L knee straight or bent. Do not rest your foot on anything. Flex your left ankle to tilt the top of your foot toward your shin. This is called dorsiflexion. Hold this position for 5 seconds. Point your toes downward to tilt the top of your foot away from your shin. This is called plantar flexion. Hold this position for 5 seconds. Repeat 10 times. Complete this exercise 2-3 times a day.  As tolerated  Ankle alphabet  Sit with your L foot supported at your lower leg. Do not rest your foot on anything. Make sure your foot has room to move freely. Think of your L foot as a paintbrush: Move your foot to trace each letter of the  alphabet in the air. Keep your hip and knee still while you trace the letters. Trace every letter from A to Z. Make the letters as large as you can without causing or increasing any discomfort.  Repeat 2-3 times. Complete this exercise 2-3 times a day.   Strengthening exercises These exercises build strength and endurance in your ankle. Endurance is the ability to use your muscles for a long time, even after they get tired. Dorsiflexors These are muscles that lift your foot up. Secure a rubber exercise band or tube to an object, such as a table leg, that will stay still when the band is pulled. Secure the other end around your L foot. Sit on the floor, facing the object with your L leg extended. The band or tube should be slightly tense when your foot is relaxed. Slowly flex your L ankle and toes to bring your foot toward your shin. Hold this position for 5 seconds. Slowly return your foot to the starting position, controlling the band as you do that. Repeat 10 times. Complete this exercise 2-3 times a day.  Plantar flexors These are muscles that push your foot down. Sit on the floor with your L leg extended. Loop a rubber exercise band or tube around the ball of your L foot. The ball of your foot is on the walking surface, right under your toes. The band or tube should be slightly tense when  your foot is relaxed. Slowly point your toes downward, pushing them away from you. Hold this position for 5 seconds. Slowly release the tension in the band or tube, controlling smoothly until your foot is back in the starting position. Repeat 10 times. Complete this exercise 2-3 times a day.  Towel curls  Sit in a chair on a non-carpeted surface, and put your feet on the floor. Place a towel in front of your feet. Keeping your heel on the floor, put your L foot on the towel. Pull the towel toward you by grabbing the towel with your toes and curling them under. Keep your heel on the floor. Let  your toes relax. Grab the towel again. Keep pulling the towel until it is completely underneath your foot. Repeat 10 times. Complete this exercise 2-3 times a day.  Standing plantar flexion This is an exercise in which you use your toes to lift your body's weight while standing. Stand with your feet shoulder-width apart. Keep your weight spread evenly over the width of your feet while you rise up on your toes. Use a wall or table to steady yourself if needed, but try not to use it for support. If this exercise is too easy, try these options: Shift your weight toward your L leg until you feel challenged. If told by your health care provider, lift your uninjured leg off the floor. Hold this position for 5 seconds. Repeat 10 times. Complete this exercise 2-3 times a day.  Tandem walking Stand with one foot directly in front of the other. Slowly raise your back foot up, lifting your heel before your toes, and place it directly in front of your other foot. Continue to walk in this heel-to-toe way. Have a countertop or wall nearby to use if needed to keep your balance, but try not to hold onto anything for support.  Repeat 10 times. Complete this exercise 2-3 times a day.

## 2024-01-28 ENCOUNTER — Encounter: Payer: Self-pay | Admitting: Orthopedic Surgery
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
## 2024-02-09 ENCOUNTER — Telehealth: Payer: Self-pay | Admitting: Orthopedic Surgery

## 2024-02-09 NOTE — Telephone Encounter (Signed)
 Forms completed on 7/2

## 2024-02-09 NOTE — Telephone Encounter (Signed)
 Per Sari pt submitted forms, medical release forms and $20.00 payment for [place] office.

## 2024-02-23 ENCOUNTER — Ambulatory Visit: Admitting: Orthopedic Surgery

## 2024-02-24 ENCOUNTER — Telehealth: Payer: Self-pay | Admitting: Orthopedic Surgery

## 2024-02-24 ENCOUNTER — Encounter: Payer: Self-pay | Admitting: Orthopedic Surgery

## 2024-02-24 NOTE — Telephone Encounter (Signed)
 Dr. Onesimo pt - spoke w/the pt, she is requesting to return to work w/no restrictions, stated the note given didn't have "no restrictions", please advise if ok to add.  [phone]

## 2024-03-31 ENCOUNTER — Encounter (HOSPITAL_COMMUNITY): Payer: Self-pay

## 2024-03-31 ENCOUNTER — Emergency Department (HOSPITAL_COMMUNITY)
Admission: EM | Admit: 2024-03-31 | Discharge: 2024-03-31 | Disposition: A | Discharge status: Home / Self Care | Attending: Emergency Medicine | Admitting: Emergency Medicine

## 2024-03-31 ENCOUNTER — Other Ambulatory Visit: Payer: Self-pay

## 2024-03-31 DIAGNOSIS — Z79899 Other long term (current) drug therapy: Secondary | ICD-10-CM | POA: Insufficient documentation

## 2024-03-31 DIAGNOSIS — I10 Essential (primary) hypertension: Secondary | ICD-10-CM | POA: Diagnosis not present

## 2024-03-31 DIAGNOSIS — M25472 Effusion, left ankle: Secondary | ICD-10-CM | POA: Diagnosis not present

## 2024-03-31 DIAGNOSIS — G8929 Other chronic pain: Secondary | ICD-10-CM | POA: Diagnosis not present

## 2024-03-31 DIAGNOSIS — M25579 Pain in unspecified ankle and joints of unspecified foot: Secondary | ICD-10-CM | POA: Diagnosis not present

## 2024-03-31 DIAGNOSIS — M25572 Pain in left ankle and joints of left foot: Secondary | ICD-10-CM | POA: Diagnosis not present

## 2024-03-31 MED ORDER — ACETAMINOPHEN 500 MG PO TABS
1000.0000 mg | ORAL_TABLET | Freq: Once | ORAL | Status: AC
  Administered 2024-03-31: 1000 mg via ORAL
  Filled 2024-03-31: qty 2

## 2024-03-31 MED ORDER — KETOROLAC TROMETHAMINE 15 MG/ML IJ SOLN
15.0000 mg | Freq: Once | INTRAMUSCULAR | Status: AC
  Administered 2024-03-31: 15 mg via INTRAMUSCULAR
  Filled 2024-03-31: qty 1
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
## 2024-03-31 NOTE — Discharge Instructions (Signed)
 We evaluated you for your ankle pain.  We do not think you have a fracture or infection in your ankle.  We would recommend following up with an orthopedic surgeon.  Please take Tylenol  (acetaminophen ) and Motrin  (ibuprofen ) for your symptoms at home.  You can take 1000 mg of Tylenol  every 6 hours and 600 mg of Motrin  every 6 hours as needed for your symptoms.  You can take these medicines together as needed, either at the same time, or alternating every 3 hours.  Please try to keep your leg elevated is much as possible.  You can also wrap it with an Ace wrap and apply ice to help with pain and swelling.  Please return if you develop any new or worsening symptoms like fevers or chills, increasing swelling, inability to walk, or any other concerning symptoms.
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
## 2024-06-06 ENCOUNTER — Encounter: Payer: Self-pay | Admitting: Radiology

## 2024-06-15 ENCOUNTER — Emergency Department (HOSPITAL_COMMUNITY)
Admission: EM | Admit: 2024-06-15 | Discharge: 2024-06-15 | Disposition: A | Discharge status: Home / Self Care | Attending: Emergency Medicine | Admitting: Emergency Medicine

## 2024-06-15 ENCOUNTER — Emergency Department (HOSPITAL_COMMUNITY)

## 2024-06-15 ENCOUNTER — Other Ambulatory Visit: Payer: Self-pay

## 2024-06-15 ENCOUNTER — Encounter (HOSPITAL_COMMUNITY): Payer: Self-pay | Admitting: *Deleted

## 2024-06-15 DIAGNOSIS — I10 Essential (primary) hypertension: Secondary | ICD-10-CM | POA: Insufficient documentation

## 2024-06-15 DIAGNOSIS — R059 Cough, unspecified: Secondary | ICD-10-CM | POA: Diagnosis not present

## 2024-06-15 DIAGNOSIS — J069 Acute upper respiratory infection, unspecified: Secondary | ICD-10-CM | POA: Insufficient documentation

## 2024-06-15 DIAGNOSIS — Z79899 Other long term (current) drug therapy: Secondary | ICD-10-CM | POA: Insufficient documentation

## 2024-06-15 DIAGNOSIS — Z20822 Contact with and (suspected) exposure to covid-19: Secondary | ICD-10-CM | POA: Insufficient documentation

## 2024-06-15 DIAGNOSIS — R030 Elevated blood-pressure reading, without diagnosis of hypertension: Secondary | ICD-10-CM

## 2024-06-15 DIAGNOSIS — R079 Chest pain, unspecified: Secondary | ICD-10-CM | POA: Diagnosis not present

## 2024-06-15 DIAGNOSIS — R0981 Nasal congestion: Secondary | ICD-10-CM

## 2024-06-15 DIAGNOSIS — B9789 Other viral agents as the cause of diseases classified elsewhere: Secondary | ICD-10-CM | POA: Diagnosis not present

## 2024-06-15 LAB — RESP PANEL BY RT-PCR (RSV, FLU A&B, COVID)  RVPGX2
Influenza A by PCR: NEGATIVE
Influenza B by PCR: NEGATIVE
Resp Syncytial Virus by PCR: NEGATIVE
SARS Coronavirus 2 by RT PCR: NEGATIVE

## 2024-06-15 MED ORDER — GUAIFENESIN 100 MG/5ML PO LIQD
10.0000 mL | Freq: Once | ORAL | Status: AC
  Administered 2024-06-15: 10 mL via ORAL
  Filled 2024-06-15: qty 10

## 2024-06-15 MED ORDER — ACETAMINOPHEN 500 MG PO TABS
1000.0000 mg | ORAL_TABLET | Freq: Once | ORAL | Status: AC
  Administered 2024-06-15: 1000 mg via ORAL
  Filled 2024-06-15: qty 2

## 2024-06-15 NOTE — Discharge Instructions (Addendum)
  It was our pleasure to provide your ER care today - we hope that you feel better.  Your covid and flu tests are negative and chest xray looks good. Your symptoms/exam appear most consistent with a viral type of upper respiratory illness that should run its course and get better in the next several days.   Drink plenty of fluids/stay well hydrated.  You may try nyquil, mucinex, robitussin or similar over the counter cold and flu medication for symptom relief.   Follow up with primary care doctor in one week if symptoms fail to improve/resolve. Also follow up closely with your doctor regarding your high blood pressure.   Return to ER if worse, new symptoms, increased trouble breathing, new/severe pain, severe headache, severe sinus pain/pressure, chest pain, or other emergency concern.

## 2024-06-15 NOTE — ED Provider Notes (Signed)
 [place] EMERGENCY DEPARTMENT AT [facility] Provider Note   CSN: [id number] Arrival date & time: 06/15/24  1723     Patient presents with: Nasal Congestion   Vanessa [name] is a 58 y.o. female.   Pt with c/o nasal congestion, non prod cough, body aches, in past 1-2 days. Family member with current uri symptoms, but no other specific known ill contacts. No focal sinus pain/pressure. No headache. No severe ear pain. No eye pain. No sore throat or trouble swallowing. No neck pain or stiffness. No sob. Cough non prod. No hemoptysis. No leg pain or swelling. No rash. No chest pain or abd pain. No nvd. No dysuria or gu c/o.  No known fevers. Non smoker.   The history is provided by the patient, medical records and a relative.       Prior to Admission medications   Medication Sig Start Date End Date Taking? Authorizing Provider  acetaminophen  (TYLENOL ) 325 MG tablet Take 2 tablets (650 mg total) by mouth every 6 (six) hours as needed. 12/23/23   Elnor Jayson LABOR, DO  cyclobenzaprine  (FLEXERIL ) 10 MG tablet Take 1 tablet (10 mg total) by mouth 2 (two) times daily as needed for muscle spasms. 12/23/23   Elnor Jayson LABOR, DO  ibuprofen  (ADVIL ) 600 MG tablet Take 1 tablet (600 mg total) by mouth every 6 (six) hours as needed. 12/23/23   Elnor Jayson LABOR, DO  lidocaine  (LIDODERM ) 5 % Place 1 patch onto the skin daily as needed. Remove & Discard patch within 12 hours or as directed by MD 12/23/23   Elnor Jayson LABOR, DO  losartan  (COZAAR ) 25 MG tablet Take 25 mg by mouth daily. 10/03/22   [provider]  meclizine  (ANTIVERT ) 25 MG tablet Take 1 tablet (25 mg total) by mouth 3 (three) times daily as needed for dizziness. 11/26/22   Freddi Hamilton, MD  meloxicam (MOBIC) 7.5 MG tablet Take 15 mg by mouth daily. 09/18/23   [provider]  oxyCODONE  (ROXICODONE ) 5 MG immediate release tablet Take 1 tablet (5 mg total) by mouth every 4 (four) hours as needed for severe pain (pain score  7-10). 12/23/23   Elnor Jayson A, DO  predniSONE  (STERAPRED UNI-PAK 21 TAB) 10 MG (21) TBPK tablet 10 mg DS 12 as directed 01/26/24   Onesimo Oneil LABOR, MD  tiZANidine (ZANAFLEX) 4 MG tablet Take 4 mg by mouth every 6 (six) hours. 09/18/23   [provider]    Allergies: Patient has no known allergies.    Review of Systems  Constitutional:  Negative for chills and fever.  HENT:  Positive for congestion and rhinorrhea. Negative for sore throat and trouble swallowing.   Eyes:  Negative for pain, discharge and redness.  Respiratory:  Positive for cough. Negative for shortness of breath.   Cardiovascular:  Negative for chest pain and leg swelling.  Gastrointestinal:  Negative for abdominal pain, diarrhea and vomiting.  Genitourinary:  Negative for dysuria and flank pain.  Musculoskeletal:  Positive for myalgias. Negative for back pain, neck pain and neck stiffness.  Skin:  Negative for rash.  Neurological:  Negative for headaches.    Updated Vital Signs BP (!) 152/82   Pulse (!) 54   Temp 99.1 F (37.3 C) (Oral)   Resp 19   Ht 1.702 m (5' 7)   Wt 61.7 kg   SpO2 99%   BMI 21.30 kg/m   Physical Exam Vitals and nursing note reviewed.  Constitutional:  Appearance: Normal appearance. She is well-developed.  HENT:     Head: Atraumatic.     Comments: No mastoid, sinus, or temporal tenderness.     Right Ear: Tympanic membrane, ear canal and external ear normal.     Left Ear: Tympanic membrane, ear canal and external ear normal.     Nose: Congestion and rhinorrhea present.     Mouth/Throat:     Mouth: Mucous membranes are moist.     Pharynx: Oropharynx is clear. No oropharyngeal exudate or posterior oropharyngeal erythema.  Eyes:     General: No scleral icterus.    Conjunctiva/sclera: Conjunctivae normal.     Pupils: Pupils are equal, round, and reactive to light.  Neck:     Trachea: No tracheal deviation.     Comments: No supple, no stiffness or rigidity. No l/a or  mass.  Cardiovascular:     Rate and Rhythm: Regular rhythm.     Pulses: Normal pulses.     Heart sounds: Normal heart sounds. No murmur heard.    No friction rub. No gallop.  Pulmonary:     Effort: Pulmonary effort is normal. No respiratory distress.     Breath sounds: Normal breath sounds.  Abdominal:     General: Bowel sounds are normal. There is no distension.     Palpations: Abdomen is soft.     Tenderness: There is no abdominal tenderness. There is no guarding.  Genitourinary:    Comments: No cva tenderness.  Musculoskeletal:        General: No swelling or tenderness.     Cervical back: Normal range of motion and neck supple. No rigidity. No muscular tenderness.     Right lower leg: No edema.     Left lower leg: No edema.  Lymphadenopathy:     Cervical: No cervical adenopathy.  Skin:    General: Skin is warm and dry.     Findings: No rash.  Neurological:     Mental Status: She is alert.     Comments: Alert, speech normal. Motor/sens grossly intact bil.   Psychiatric:        Mood and Affect: Mood normal.     (all labs ordered are listed, but only abnormal results are displayed) Results for orders placed or performed during the hospital encounter of 06/15/24  Resp panel by RT-PCR (RSV, Flu A&B, Covid) Anterior Nasal Swab   Collection Time: 06/15/24  5:47 PM   Specimen: Anterior Nasal Swab  Result Value Ref Range   SARS Coronavirus 2 by RT PCR NEGATIVE NEGATIVE   Influenza A by PCR NEGATIVE NEGATIVE   Influenza B by PCR NEGATIVE NEGATIVE   Resp Syncytial Virus by PCR NEGATIVE NEGATIVE   DG Chest 2 View Result Date: 06/15/2024 CLINICAL DATA:  Chest pain EXAM: DG CHEST 2V COMPARISON:  Chest x-ray 12/23/2023 FINDINGS: The heart size and mediastinal contours are within normal limits. Both lungs are clear. The visualized skeletal structures are unremarkable. IMPRESSION: No active cardiopulmonary disease. Electronically Signed   By: Greig Pique M.D.   On: 06/15/2024 18:15     EKG: EKG Interpretation Date/Time:  Wednesday June 15 2024 17:49:24 EST Ventricular Rate:  58 PR Interval:  148 QRS Duration:  78 QT Interval:  354 QTC Calculation: 347 R Axis:   28  Text Interpretation: Sinus bradycardia Nonspecific T wave abnormality Confirmed by Bernard Drivers ([id number]) on 06/15/2024 5:56:09 PM  Radiology: [facility] Chest 2 View Result Date: 06/15/2024 CLINICAL DATA:  Chest pain EXAM: DG CHEST 2V  COMPARISON:  Chest x-ray 12/23/2023 FINDINGS: The heart size and mediastinal contours are within normal limits. Both lungs are clear. The visualized skeletal structures are unremarkable. IMPRESSION: No active cardiopulmonary disease. Electronically Signed   By: Greig Pique M.D.   On: 06/15/2024 18:15     Procedures   Medications Ordered in the ED  acetaminophen  (TYLENOL ) tablet 1,000 mg (has no administration in time range)  guaiFENesin (ROBITUSSIN) 100 MG/5ML liquid 10 mL (has no administration in time range)                                    Medical Decision Making Problems Addressed: Elevated blood pressure reading: acute illness or injury Essential hypertension: chronic illness or injury Nasal congestion: acute illness or injury Viral URI with cough: acute illness or injury with systemic symptoms  Amount and/or Complexity of Data Reviewed Independent Historian:     Details: Family, hx External Data Reviewed: notes. Labs: ordered. Decision-making details documented in ED Course. Radiology: ordered and independent interpretation performed. Decision-making details documented in ED Course. ECG/medicine tests: independent interpretation performed. Decision-making details documented in ED Course.  Risk OTC drugs.   Reviewed nursing notes and prior charts for additional history.   Labs sent.   Po fluids. Acetaminophen  po, robitussin po.   Labs reviewed/interpreted by me - covid and flu neg.   Cxr reviewed/interpreted by me - no pna.   Symptoms and  exam felt most c/w viral syndrome.   Rec symptomatic therapy, hydration, pcp f/u.  Return precautions provided.        Final diagnoses:  None    ED Discharge Orders     None          Bernard Drivers, MD 06/15/24 1939
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
## 2024-07-06 ENCOUNTER — Encounter (HOSPITAL_COMMUNITY): Payer: Self-pay

## 2024-07-06 ENCOUNTER — Emergency Department (HOSPITAL_COMMUNITY)
Admission: EM | Admit: 2024-07-06 | Discharge: 2024-07-06 | Disposition: A | Discharge status: Home / Self Care | Attending: Emergency Medicine | Admitting: Emergency Medicine

## 2024-07-06 ENCOUNTER — Other Ambulatory Visit: Payer: Self-pay

## 2024-07-06 DIAGNOSIS — Z79899 Other long term (current) drug therapy: Secondary | ICD-10-CM | POA: Insufficient documentation

## 2024-07-06 DIAGNOSIS — R0981 Nasal congestion: Secondary | ICD-10-CM | POA: Diagnosis not present

## 2024-07-06 DIAGNOSIS — I10 Essential (primary) hypertension: Secondary | ICD-10-CM | POA: Insufficient documentation

## 2024-07-06 DIAGNOSIS — J329 Chronic sinusitis, unspecified: Secondary | ICD-10-CM | POA: Insufficient documentation

## 2024-07-06 LAB — GROUP A STREP BY PCR: Group A Strep by PCR: NOT DETECTED

## 2024-07-06 LAB — RESP PANEL BY RT-PCR (RSV, FLU A&B, COVID)  RVPGX2
Influenza A by PCR: NEGATIVE
Influenza B by PCR: NEGATIVE
Resp Syncytial Virus by PCR: NEGATIVE
SARS Coronavirus 2 by RT PCR: NEGATIVE

## 2024-07-06 MED ORDER — PREDNISONE 20 MG PO TABS: 40.0000 mg | ORAL_TABLET | Freq: Every day | ORAL | 0 refills | Status: AC

## 2024-07-06 NOTE — Discharge Instructions (Signed)
 You may take Tylenol  every 4 hours if needed for body aches or fever.  Drink plenty of fluids.  You can use over-the-counter Chloraseptic spray to your throat if needed.  Please take the prednisone  as directed until finished.  Return to the ER for any new or worsening symptoms.

## 2024-07-06 NOTE — ED Provider Notes (Signed)
 [place] EMERGENCY DEPARTMENT AT [facility] Provider Note   CSN: [id number] Arrival date & time: 07/06/24  1342     Patient presents with: Nasal Congestion   Vanessa [name] is a 58 y.o. female.   HPI      Vanessa [name] is a 58 y.o. female hypertension who presents to the Emergency Department complaining of sneezing, nasal congestion, body aches and sore throat.  Symptoms began few days ago.  She endorses recurrent sneezing and nasal congestion with pressure and pain of her sinuses.  She was here with URI 1 month ago, she felt like she improved from her symptoms but she was around sick children over the holiday.  She denies any chest pain significant cough or shortness of breath.  Hypertensive on arrival, did not take her blood pressure medication today.    Prior to Admission medications   Medication Sig Start Date End Date Taking? Authorizing Provider  acetaminophen  (TYLENOL ) 325 MG tablet Take 2 tablets (650 mg total) by mouth every 6 (six) hours as needed. 12/23/23   Elnor Jayson LABOR, DO  cyclobenzaprine  (FLEXERIL ) 10 MG tablet Take 1 tablet (10 mg total) by mouth 2 (two) times daily as needed for muscle spasms. 12/23/23   Elnor Jayson LABOR, DO  ibuprofen  (ADVIL ) 600 MG tablet Take 1 tablet (600 mg total) by mouth every 6 (six) hours as needed. 12/23/23   Elnor Jayson LABOR, DO  lidocaine  (LIDODERM ) 5 % Place 1 patch onto the skin daily as needed. Remove & Discard patch within 12 hours or as directed by MD 12/23/23   Elnor Jayson A, DO  losartan  (COZAAR ) 25 MG tablet Take 25 mg by mouth daily. 10/03/22   [provider]  meclizine  (ANTIVERT ) 25 MG tablet Take 1 tablet (25 mg total) by mouth 3 (three) times daily as needed for dizziness. 11/26/22   Freddi Hamilton, MD  meloxicam (MOBIC) 7.5 MG tablet Take 15 mg by mouth daily. 09/18/23   [provider]  oxyCODONE  (ROXICODONE ) 5 MG immediate release tablet Take 1 tablet (5 mg total) by mouth every 4 (four) hours as  needed for severe pain (pain score 7-10). 12/23/23   Elnor Jayson A, DO  predniSONE  (DELTASONE ) 20 MG tablet Take 2 tablets (40 mg total) by mouth daily. 07/06/24  Yes Sostenes Kauffmann, PA-C  tiZANidine (ZANAFLEX) 4 MG tablet Take 4 mg by mouth every 6 (six) hours. 09/18/23   [provider]    Allergies: Patient has no known allergies.    Review of Systems  Constitutional:  Negative for chills and fever.  HENT:  Positive for congestion, rhinorrhea, sinus pressure, sinus pain and sore throat. Negative for trouble swallowing and voice change.   Eyes:  Negative for visual disturbance.  Respiratory:  Negative for cough and shortness of breath.   Gastrointestinal:  Negative for abdominal pain, diarrhea, nausea and vomiting.  Genitourinary:  Negative for dysuria and flank pain.  Musculoskeletal:  Negative for neck pain.  Neurological:  Negative for dizziness, weakness, numbness and headaches.    Updated Vital Signs BP (!) 154/104 (BP Location: Right Arm)   Pulse 86   Temp 98.5 F (36.9 C) (Oral)   Resp 18   Ht 5' 7 (1.702 m)   Wt 61.7 kg   SpO2 98%   BMI 21.30 kg/m   Physical Exam Vitals and nursing note reviewed.  Constitutional:      General: She is not in acute distress.    Appearance: Normal appearance.  She is not ill-appearing or toxic-appearing.  HENT:     Ears:     Comments: Air-fluid levels bilateral middle ears.  No erythema or bulging of the TMs.  No mastoid tenderness.    Nose: Rhinorrhea present. No congestion.     Right Turbinates: Swollen.     Left Turbinates: Swollen.     Right Sinus: Maxillary sinus tenderness present.     Left Sinus: Maxillary sinus tenderness present.     Mouth/Throat:     Mouth: Mucous membranes are moist.     Pharynx: Oropharynx is clear. No oropharyngeal exudate or posterior oropharyngeal erythema.  Cardiovascular:     Rate and Rhythm: Normal rate and regular rhythm.     Pulses: Normal pulses.  Pulmonary:     Effort: Pulmonary  effort is normal.  Abdominal:     Palpations: Abdomen is soft.     Tenderness: There is no abdominal tenderness.  Musculoskeletal:        General: Normal range of motion.     Cervical back: Normal range of motion.  Lymphadenopathy:     Cervical: No cervical adenopathy.  Skin:    General: Skin is warm.     Capillary Refill: Capillary refill takes less than 2 seconds.  Neurological:     General: No focal deficit present.     Mental Status: She is alert.     Sensory: No sensory deficit.     Motor: No weakness.     (all labs ordered are listed, but only abnormal results are displayed) Labs Reviewed  GROUP A STREP BY PCR  RESP PANEL BY RT-PCR (RSV, FLU A&B, COVID)  [id number]    EKG: None  Radiology: No results found.   Procedures   Medications Ordered in the ED - No data to display                                  Medical Decision Making    Patient here for sinus pressure pain sneezing nasal congestion body aches and sore throat.  Symptoms for several days and began after being exposed to sick children over the holiday.  She denies any significant cough chest pain or shortness of breath no GI symptoms and no reported fever.   My exam, patient well-appearing nontoxic.  She has reassuring vital signs although hypertensive.  She is on antihypertensive medications.  She has not taken them today and states that she takes her blood pressure medication at night. She does have some tenderness of the bilateral maxillary sinuses and turbinates appear swollen.  She has some air-fluid levels to the bilateral middle ears.  I do not appreciate a an otitis.  And her lung sounds are clear to auscultation.  Suspect this is viral, I doubt emergent process.  She is agreeable to symptomatic treatment and close outpatient follow-up if needed.   Risk Prescription drug management.        Final diagnoses:  Sinusitis, unspecified chronicity, unspecified location    ED Discharge Orders           Ordered    predniSONE  (DELTASONE ) 20 MG tablet  Daily        07/06/24 1850               Herlinda Milling, PA-C 07/06/24 JUDITHANN Suzette Pac, MD 07/08/24 1222

## 2024-07-06 NOTE — ED Triage Notes (Signed)
 Pt arrived via POV c/o recurrent congestion, body aches, sore throat and fatigue. Pt reports being seen here recently for same. Pt reports she did start to feel a little better a week ago, but symptoms have returned.
